# Patient Record
Sex: Female | Born: 1960 | Hispanic: Yes
Health system: Southern US, Community
[De-identification: ages and names within clinical notes are randomized; demographics above are authoritative.]

## PROBLEM LIST (undated history)

## (undated) DIAGNOSIS — E7800 Pure hypercholesterolemia, unspecified: Secondary | ICD-10-CM

## (undated) DIAGNOSIS — I10 Essential (primary) hypertension: Secondary | ICD-10-CM

---

## 2000-05-05 HISTORY — PX: BREAST BIOPSY: SHX20

## 2010-03-20 ENCOUNTER — Other Ambulatory Visit: Admission: RE | Admit: 2010-03-20 | Discharge: 2010-03-20 | Payer: Self-pay | Admitting: Family Medicine

## 2010-07-19 ENCOUNTER — Emergency Department (HOSPITAL_COMMUNITY)
Admission: EM | Admit: 2010-07-19 | Discharge: 2010-07-19 | Disposition: A | Discharge status: Home / Self Care | Payer: 59 | Attending: Emergency Medicine | Admitting: Emergency Medicine

## 2010-07-19 ENCOUNTER — Emergency Department (HOSPITAL_COMMUNITY): Payer: 59

## 2010-07-19 DIAGNOSIS — Z79899 Other long term (current) drug therapy: Secondary | ICD-10-CM | POA: Insufficient documentation

## 2010-07-19 DIAGNOSIS — R51 Headache: Secondary | ICD-10-CM | POA: Insufficient documentation

## 2010-07-19 DIAGNOSIS — R110 Nausea: Secondary | ICD-10-CM | POA: Insufficient documentation

## 2010-07-19 DIAGNOSIS — I10 Essential (primary) hypertension: Secondary | ICD-10-CM | POA: Insufficient documentation

## 2010-07-19 LAB — POCT I-STAT, CHEM 8
Calcium, Ion: 1.1 mmol/L — ABNORMAL LOW (ref 1.12–1.32)
HCT: 41 % (ref 36.0–46.0)
Hemoglobin: 13.9 g/dL (ref 12.0–15.0)
TCO2: 26 mmol/L (ref 0–100)

## 2010-07-19 LAB — CBC
MCH: 23.8 pg — ABNORMAL LOW (ref 26.0–34.0)
Platelets: 286 10*3/uL (ref 150–400)
RBC: 5.01 MIL/uL (ref 3.87–5.11)
RDW: 20.1 % — ABNORMAL HIGH (ref 11.5–15.5)
WBC: 4.8 10*3/uL (ref 4.0–10.5)

## 2010-07-19 LAB — DIFFERENTIAL
Basophils Relative: 2 % — ABNORMAL HIGH (ref 0–1)
Eosinophils Absolute: 0.2 10*3/uL (ref 0.0–0.7)
Neutrophils Relative %: 54 % (ref 43–77)

## 2011-08-08 ENCOUNTER — Other Ambulatory Visit (HOSPITAL_COMMUNITY)
Admission: RE | Admit: 2011-08-08 | Discharge: 2011-08-08 | Disposition: A | Discharge status: Home / Self Care | Payer: 59 | Source: Ambulatory Visit | Attending: Family Medicine | Admitting: Family Medicine

## 2011-08-08 DIAGNOSIS — Z124 Encounter for screening for malignant neoplasm of cervix: Secondary | ICD-10-CM | POA: Insufficient documentation

## 2011-09-10 ENCOUNTER — Other Ambulatory Visit: Payer: Self-pay | Admitting: Family Medicine

## 2011-09-10 DIAGNOSIS — Z1231 Encounter for screening mammogram for malignant neoplasm of breast: Secondary | ICD-10-CM

## 2011-10-07 ENCOUNTER — Ambulatory Visit: Payer: 59

## 2012-10-09 IMAGING — CT CT HEAD W/O CM
1 of 2 series · 13 of 30 positions shown, 17 images · non-contrast
Comparison: None.

CLINICAL DATA: Frontal headache

CT HEAD WITHOUT CONTRAST
TECHNIQUE: Contiguous axial images were obtained from the base of
the skull through the vertex without contrast.

[Series 2: brain · axial · 0.47mm/px · z∈[+133,+256]mm · 13 of 28 slices shown, 17 images]
[im 2/28  brain]
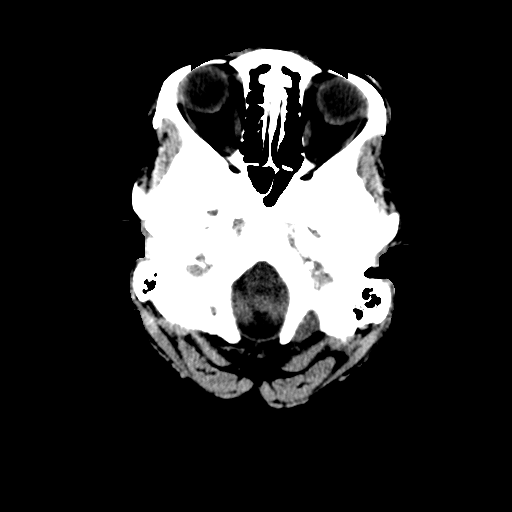
[im 2/28  bone]
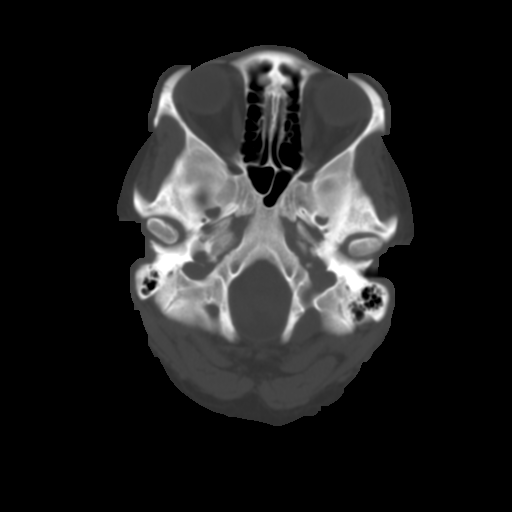
[im 4/28  brain]
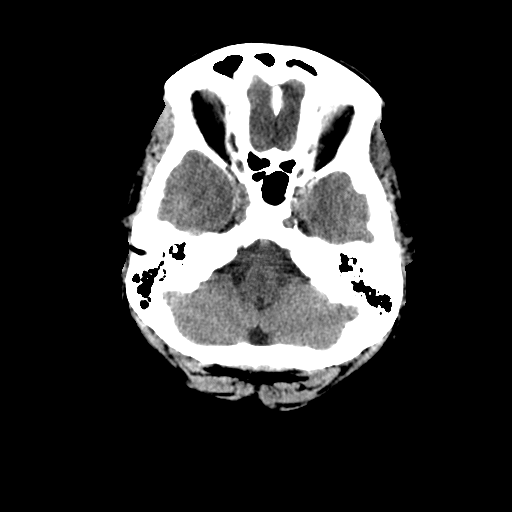
[im 6/28  brain]
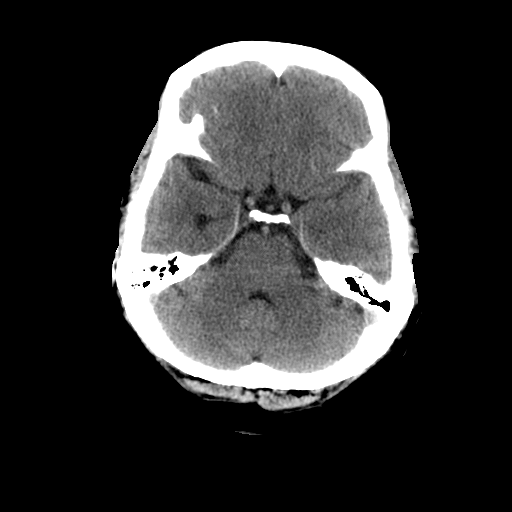
[im 8/28  brain]
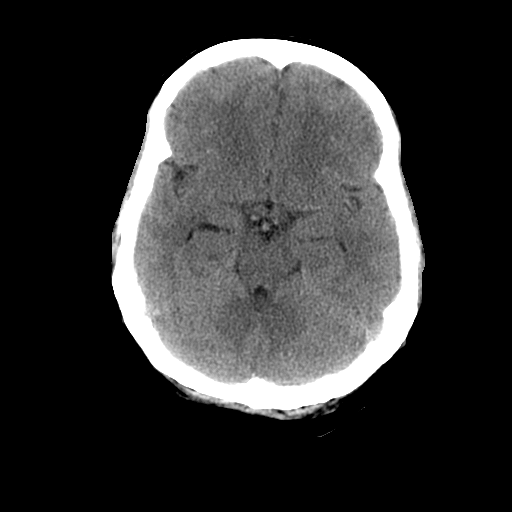
[im 10/28  brain]
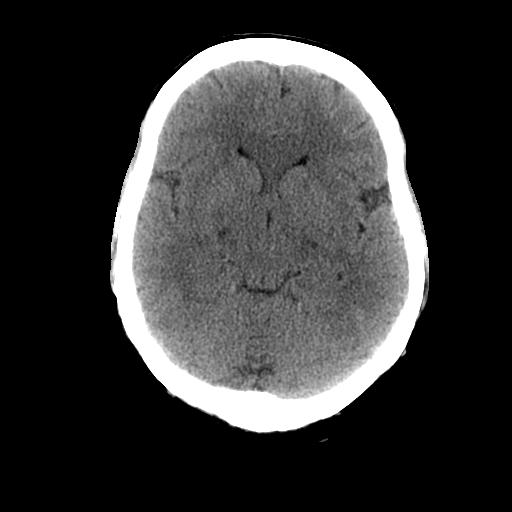
[im 10/28  bone]
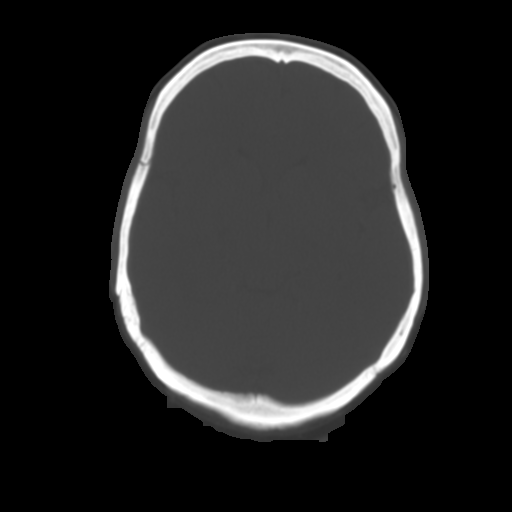
[im 12/28  brain]
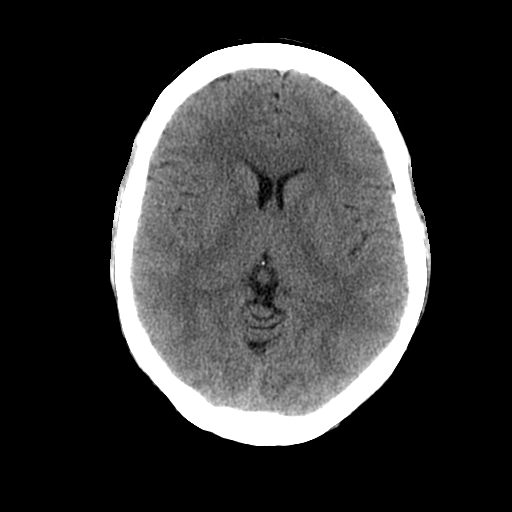
[im 14/28  brain]
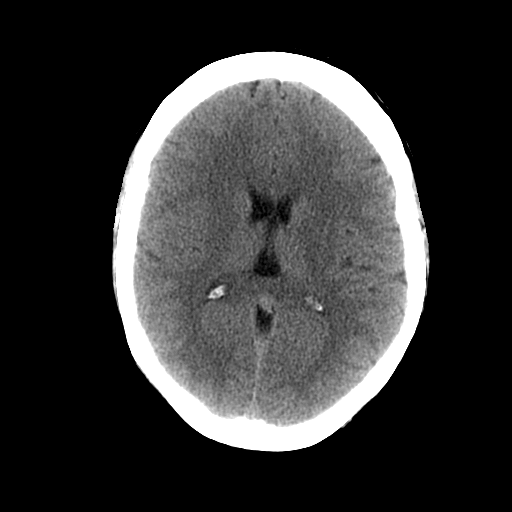
[im 16/28  brain]
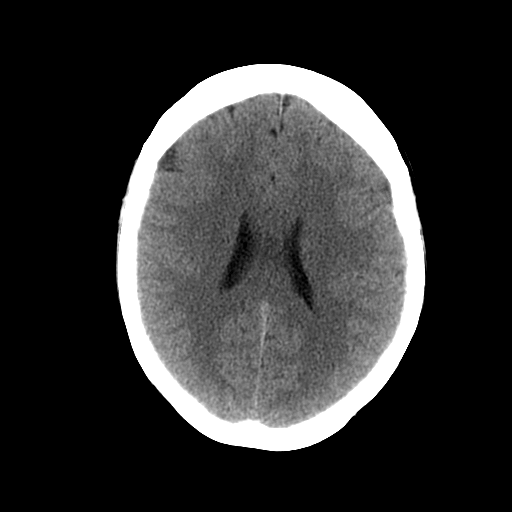
[im 18/28  brain]
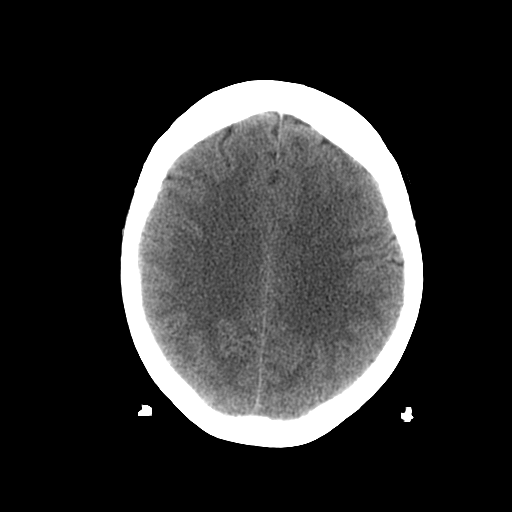
[im 18/28  bone]
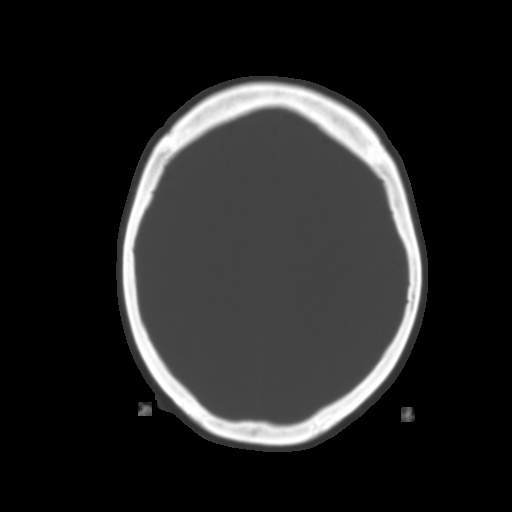
[im 20/28  brain]
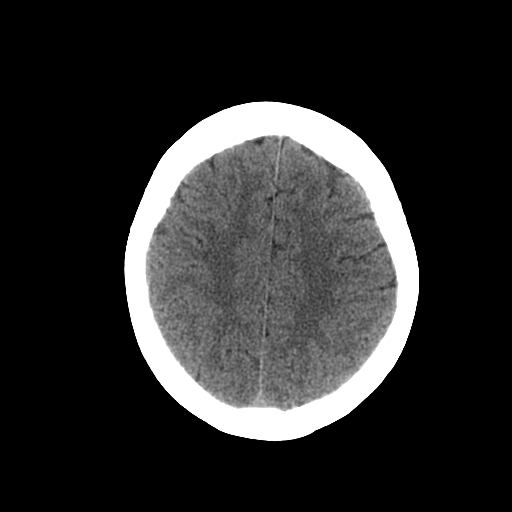
[im 22/28  brain]
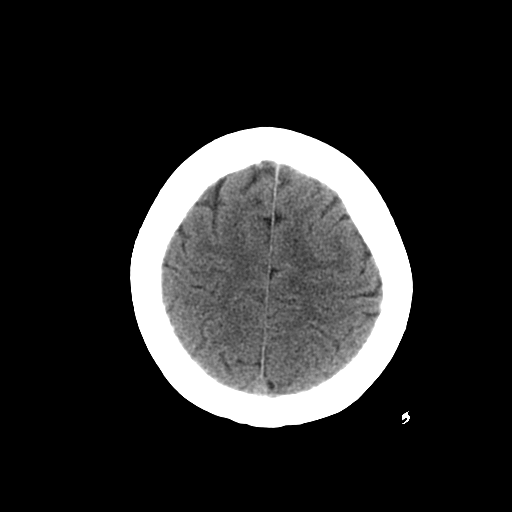
[im 24/28  brain]
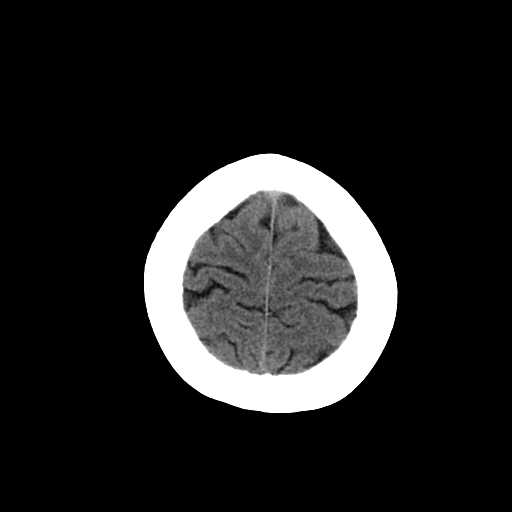
[im 26/28  brain]
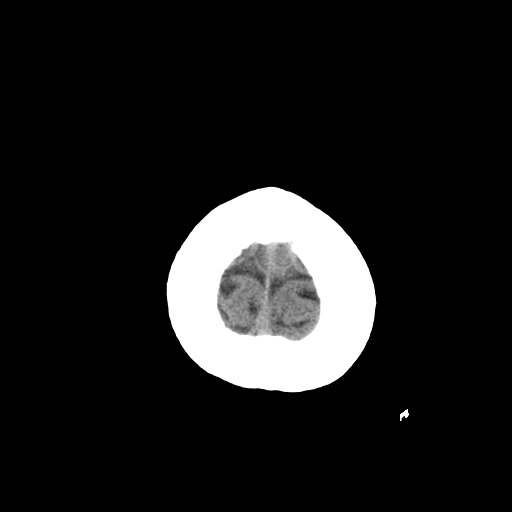
[im 26/28  bone]
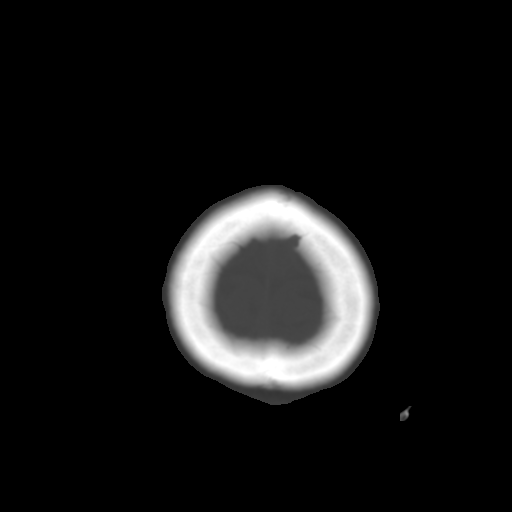

[13 of 30 positions shown; findings below may reference images not displayed]

FINDINGS: No skull fracture is noted.  Paranasal sinuses and
mastoid air cells are unremarkable.

No intracranial hemorrhage, mass effect or midline shift.

No acute infarction.  No mass lesion is noted on this unenhanced
scan.

The gray and white matter differentiation is preserved.

No intra or extra-axial fluid collection.
IMPRESSION: No acute intracranial abnormality.

## 2014-03-06 ENCOUNTER — Other Ambulatory Visit (HOSPITAL_COMMUNITY): Payer: Self-pay | Admitting: Family Medicine

## 2014-03-06 DIAGNOSIS — Z1231 Encounter for screening mammogram for malignant neoplasm of breast: Secondary | ICD-10-CM

## 2014-03-08 ENCOUNTER — Other Ambulatory Visit: Payer: Self-pay

## 2014-03-08 DIAGNOSIS — Z1231 Encounter for screening mammogram for malignant neoplasm of breast: Secondary | ICD-10-CM

## 2014-03-28 ENCOUNTER — Ambulatory Visit (HOSPITAL_COMMUNITY): Payer: 59

## 2014-04-05 ENCOUNTER — Ambulatory Visit
Admission: RE | Admit: 2014-04-05 | Discharge: 2014-04-05 | Disposition: A | Discharge status: Home / Self Care | Payer: 59 | Source: Ambulatory Visit

## 2014-04-05 DIAGNOSIS — Z1231 Encounter for screening mammogram for malignant neoplasm of breast: Secondary | ICD-10-CM

## 2014-07-11 ENCOUNTER — Other Ambulatory Visit (HOSPITAL_COMMUNITY)
Admission: RE | Admit: 2014-07-11 | Discharge: 2014-07-11 | Disposition: A | Discharge status: Home / Self Care | Payer: 59 | Source: Ambulatory Visit | Attending: Family Medicine | Admitting: Family Medicine

## 2014-07-11 ENCOUNTER — Other Ambulatory Visit: Payer: Self-pay | Admitting: Family Medicine

## 2014-07-11 DIAGNOSIS — Z124 Encounter for screening for malignant neoplasm of cervix: Secondary | ICD-10-CM | POA: Diagnosis present

## 2014-07-12 LAB — CYTOLOGY - PAP

## 2016-05-13 DIAGNOSIS — E785 Hyperlipidemia, unspecified: Secondary | ICD-10-CM | POA: Diagnosis not present

## 2016-05-13 DIAGNOSIS — E559 Vitamin D deficiency, unspecified: Secondary | ICD-10-CM | POA: Diagnosis not present

## 2016-07-31 ENCOUNTER — Other Ambulatory Visit (HOSPITAL_COMMUNITY)
Admission: RE | Admit: 2016-07-31 | Discharge: 2016-07-31 | Disposition: A | Discharge status: Home / Self Care | Payer: 59 | Source: Ambulatory Visit | Attending: Family Medicine | Admitting: Family Medicine

## 2016-07-31 ENCOUNTER — Other Ambulatory Visit: Payer: Self-pay | Admitting: Family Medicine

## 2016-07-31 DIAGNOSIS — Z79899 Other long term (current) drug therapy: Secondary | ICD-10-CM | POA: Diagnosis not present

## 2016-07-31 DIAGNOSIS — Z01411 Encounter for gynecological examination (general) (routine) with abnormal findings: Secondary | ICD-10-CM | POA: Diagnosis not present

## 2016-07-31 DIAGNOSIS — Z0000 Encounter for general adult medical examination without abnormal findings: Secondary | ICD-10-CM | POA: Diagnosis not present

## 2016-07-31 DIAGNOSIS — E559 Vitamin D deficiency, unspecified: Secondary | ICD-10-CM | POA: Diagnosis not present

## 2016-07-31 DIAGNOSIS — E785 Hyperlipidemia, unspecified: Secondary | ICD-10-CM | POA: Diagnosis not present

## 2016-07-31 DIAGNOSIS — Z1151 Encounter for screening for human papillomavirus (HPV): Secondary | ICD-10-CM | POA: Diagnosis not present

## 2016-08-05 LAB — CYTOLOGY - PAP
Diagnosis: NEGATIVE
HPV (WINDOPATH): NOT DETECTED

## 2016-08-13 ENCOUNTER — Other Ambulatory Visit: Payer: Self-pay | Admitting: Plastic Surgery

## 2016-08-13 DIAGNOSIS — N62 Hypertrophy of breast: Secondary | ICD-10-CM | POA: Diagnosis not present

## 2016-08-13 DIAGNOSIS — M542 Cervicalgia: Secondary | ICD-10-CM | POA: Diagnosis not present

## 2016-08-13 DIAGNOSIS — E66812 Obesity, class 2: Secondary | ICD-10-CM | POA: Insufficient documentation

## 2016-08-13 DIAGNOSIS — Z1231 Encounter for screening mammogram for malignant neoplasm of breast: Secondary | ICD-10-CM

## 2016-08-28 DIAGNOSIS — Z1211 Encounter for screening for malignant neoplasm of colon: Secondary | ICD-10-CM | POA: Diagnosis not present

## 2016-09-02 ENCOUNTER — Ambulatory Visit
Admission: RE | Admit: 2016-09-02 | Discharge: 2016-09-02 | Disposition: A | Discharge status: Home / Self Care | Payer: 59 | Source: Ambulatory Visit | Attending: Plastic Surgery | Admitting: Plastic Surgery

## 2016-09-02 DIAGNOSIS — Z1231 Encounter for screening mammogram for malignant neoplasm of breast: Secondary | ICD-10-CM | POA: Diagnosis not present

## 2016-11-25 DIAGNOSIS — M542 Cervicalgia: Secondary | ICD-10-CM | POA: Diagnosis not present

## 2016-11-25 DIAGNOSIS — M47816 Spondylosis without myelopathy or radiculopathy, lumbar region: Secondary | ICD-10-CM | POA: Diagnosis not present

## 2016-12-02 DIAGNOSIS — M47816 Spondylosis without myelopathy or radiculopathy, lumbar region: Secondary | ICD-10-CM | POA: Diagnosis not present

## 2016-12-08 DIAGNOSIS — M545 Low back pain: Secondary | ICD-10-CM | POA: Diagnosis not present

## 2017-01-24 DIAGNOSIS — J0100 Acute maxillary sinusitis, unspecified: Secondary | ICD-10-CM | POA: Diagnosis not present

## 2017-01-24 DIAGNOSIS — K047 Periapical abscess without sinus: Secondary | ICD-10-CM | POA: Diagnosis not present

## 2017-02-26 DIAGNOSIS — I10 Essential (primary) hypertension: Secondary | ICD-10-CM | POA: Diagnosis not present

## 2017-02-26 DIAGNOSIS — R300 Dysuria: Secondary | ICD-10-CM | POA: Diagnosis not present

## 2017-08-17 DIAGNOSIS — Z79899 Other long term (current) drug therapy: Secondary | ICD-10-CM | POA: Diagnosis not present

## 2017-08-17 DIAGNOSIS — E785 Hyperlipidemia, unspecified: Secondary | ICD-10-CM | POA: Diagnosis not present

## 2017-08-17 DIAGNOSIS — R7301 Impaired fasting glucose: Secondary | ICD-10-CM | POA: Diagnosis not present

## 2017-08-17 DIAGNOSIS — Z0000 Encounter for general adult medical examination without abnormal findings: Secondary | ICD-10-CM | POA: Diagnosis not present

## 2017-08-17 DIAGNOSIS — D509 Iron deficiency anemia, unspecified: Secondary | ICD-10-CM | POA: Diagnosis not present

## 2017-08-28 DIAGNOSIS — M25561 Pain in right knee: Secondary | ICD-10-CM | POA: Diagnosis not present

## 2017-08-28 DIAGNOSIS — M1711 Unilateral primary osteoarthritis, right knee: Secondary | ICD-10-CM | POA: Diagnosis not present

## 2017-08-28 DIAGNOSIS — G8929 Other chronic pain: Secondary | ICD-10-CM | POA: Diagnosis not present

## 2017-08-31 DIAGNOSIS — R7303 Prediabetes: Secondary | ICD-10-CM | POA: Diagnosis not present

## 2017-09-08 DIAGNOSIS — M6281 Muscle weakness (generalized): Secondary | ICD-10-CM | POA: Diagnosis not present

## 2017-09-08 DIAGNOSIS — M25561 Pain in right knee: Secondary | ICD-10-CM | POA: Diagnosis not present

## 2017-09-08 DIAGNOSIS — R262 Difficulty in walking, not elsewhere classified: Secondary | ICD-10-CM | POA: Diagnosis not present

## 2017-09-10 DIAGNOSIS — M25561 Pain in right knee: Secondary | ICD-10-CM | POA: Diagnosis not present

## 2017-09-10 DIAGNOSIS — M6281 Muscle weakness (generalized): Secondary | ICD-10-CM | POA: Diagnosis not present

## 2017-09-10 DIAGNOSIS — R262 Difficulty in walking, not elsewhere classified: Secondary | ICD-10-CM | POA: Diagnosis not present

## 2017-09-15 DIAGNOSIS — M6281 Muscle weakness (generalized): Secondary | ICD-10-CM | POA: Diagnosis not present

## 2017-09-15 DIAGNOSIS — M25561 Pain in right knee: Secondary | ICD-10-CM | POA: Diagnosis not present

## 2017-09-15 DIAGNOSIS — R262 Difficulty in walking, not elsewhere classified: Secondary | ICD-10-CM | POA: Diagnosis not present

## 2017-11-28 DIAGNOSIS — J069 Acute upper respiratory infection, unspecified: Secondary | ICD-10-CM | POA: Diagnosis not present

## 2017-11-28 DIAGNOSIS — H66002 Acute suppurative otitis media without spontaneous rupture of ear drum, left ear: Secondary | ICD-10-CM | POA: Diagnosis not present

## 2018-04-22 DIAGNOSIS — R05 Cough: Secondary | ICD-10-CM | POA: Diagnosis not present

## 2018-05-17 DIAGNOSIS — D259 Leiomyoma of uterus, unspecified: Secondary | ICD-10-CM | POA: Diagnosis not present

## 2018-05-17 DIAGNOSIS — R399 Unspecified symptoms and signs involving the genitourinary system: Secondary | ICD-10-CM | POA: Diagnosis not present

## 2018-06-07 DIAGNOSIS — R300 Dysuria: Secondary | ICD-10-CM | POA: Diagnosis not present

## 2018-06-07 DIAGNOSIS — D259 Leiomyoma of uterus, unspecified: Secondary | ICD-10-CM | POA: Diagnosis not present

## 2018-06-09 DIAGNOSIS — D259 Leiomyoma of uterus, unspecified: Secondary | ICD-10-CM | POA: Diagnosis not present

## 2019-08-18 ENCOUNTER — Ambulatory Visit: Payer: Self-pay | Attending: Internal Medicine

## 2019-08-18 DIAGNOSIS — Z23 Encounter for immunization: Secondary | ICD-10-CM

## 2019-08-18 NOTE — Progress Notes (Signed)
   Covid-19 Vaccination Clinic  Name:  Alyssa Howe    MRN: [id number] DOB: Dec 23, 1960  08/18/2019  Alyssa Howe was observed post Covid-19 immunization for 15 minutes without incident. She was provided with Vaccine Information Sheet and instruction to access the V-Safe system.   Alyssa Howe was instructed to call 911 with any severe reactions post vaccine: Marland Kitchen Difficulty breathing  . Swelling of face and throat  . A fast heartbeat  . A bad rash all over body  . Dizziness and weakness   Immunizations Administered    Name Date Dose VIS Date Route   Pfizer COVID-19 Vaccine 08/18/2019 11:24 AM 0.3 mL 04/15/2019 Intramuscular   Manufacturer: [place]   Lot: [id number]   [place]: [id number]

## 2019-09-12 ENCOUNTER — Ambulatory Visit: Payer: Self-pay | Attending: Internal Medicine

## 2019-09-12 DIAGNOSIS — Z23 Encounter for immunization: Secondary | ICD-10-CM

## 2019-09-12 NOTE — Progress Notes (Signed)
   Covid-19 Vaccination Clinic  Name:  Alyssa Howe    MRN: [id number] DOB: 11-Apr-1961  09/12/2019  Alyssa Howe was observed post Covid-19 immunization for 15 minutes without incident. She was provided with Vaccine Information Sheet and instruction to access the V-Safe system.   Alyssa Howe was instructed to call 911 with any severe reactions post vaccine: Marland Kitchen Difficulty breathing  . Swelling of face and throat  . A fast heartbeat  . A bad rash all over body  . Dizziness and weakness   Immunizations Administered    Name Date Dose VIS Date Route   Pfizer COVID-19 Vaccine 09/12/2019 11:09 AM 0.3 mL 06/29/2018 Intramuscular   Manufacturer: [place]   Lot: [id number]   [place]: [id number]

## 2019-11-22 ENCOUNTER — Other Ambulatory Visit (HOSPITAL_COMMUNITY)
Admission: RE | Admit: 2019-11-22 | Discharge: 2019-11-22 | Disposition: A | Discharge status: Home / Self Care | Payer: Managed Care, Other (non HMO) | Source: Ambulatory Visit | Attending: Family Medicine | Admitting: Family Medicine

## 2019-11-22 ENCOUNTER — Other Ambulatory Visit: Payer: Self-pay | Admitting: Family Medicine

## 2019-11-22 DIAGNOSIS — Z01411 Encounter for gynecological examination (general) (routine) with abnormal findings: Secondary | ICD-10-CM | POA: Diagnosis present

## 2019-11-23 LAB — CYTOLOGY - PAP: Diagnosis: NEGATIVE

## 2020-03-07 ENCOUNTER — Ambulatory Visit (INDEPENDENT_AMBULATORY_CARE_PROVIDER_SITE_OTHER): Payer: Managed Care, Other (non HMO)

## 2020-03-07 ENCOUNTER — Other Ambulatory Visit: Payer: Self-pay

## 2020-03-07 ENCOUNTER — Ambulatory Visit: Payer: Managed Care, Other (non HMO) | Admitting: Podiatry

## 2020-03-07 DIAGNOSIS — M722 Plantar fascial fibromatosis: Secondary | ICD-10-CM

## 2020-03-07 MED ORDER — MELOXICAM 15 MG PO TABS: 15.0000 mg | ORAL_TABLET | Freq: Every day | ORAL | 1 refills | Status: DC

## 2020-03-07 NOTE — Progress Notes (Signed)
   Subjective: 59 y.o. female presenting today for evaluation of bilateral heel pain right greater than the left.  Pain is been present for several months now.  She states that she walks approximately 8 miles per day with her dog.  She is very active however she has had consistent pain.  She went and got a new pair shoes which do help somewhat with the pain.   No past medical history on file.   Objective: Physical Exam General: The patient is alert and oriented x3 in no acute distress.  Dermatology: Skin is warm, dry and supple bilateral lower extremities. Negative for open lesions or macerations bilateral.   Vascular: Dorsalis Pedis and Posterior Tibial pulses palpable bilateral.  Capillary fill time is immediate to all digits.  Neurological: Epicritic and protective threshold intact bilateral.   Musculoskeletal: Tenderness to palpation to the plantar aspect of the bilateral heels along the plantar fascia. All other joints range of motion within normal limits bilateral. Strength 5/5 in all groups bilateral.   Radiographic exam: Normal osseous mineralization. Joint spaces preserved. No fracture/dislocation/boney destruction. No other soft tissue abnormalities or radiopaque foreign bodies.   Assessment: 1. plantar fasciitis bilateral feet  Plan of Care:  1. Patient evaluated. Xrays reviewed.   2. Injection of 0.5cc Celestone soluspan injected into the right heel 3.  Continue biking and walking as tolerated. 4.  Rx for Meloxicam ordered for patient. 5. Plantar fascial band(s) dispensed for bilateral plantar fasciitis. 6. Instructed patient regarding therapies and modalities at home to alleviate symptoms.  7. Return to clinic in 4 weeks.    *Originally from [place].  Walks her pitbull dog 8 miles per day  Edrick Kins, DPM [facility]  Dr. Edrick Kins, DPM    [address], [place] [address]                Office  [phone]  Fax [phone]

## 2021-03-06 ENCOUNTER — Other Ambulatory Visit (HOSPITAL_BASED_OUTPATIENT_CLINIC_OR_DEPARTMENT_OTHER): Payer: Self-pay | Admitting: Family Medicine

## 2021-03-06 DIAGNOSIS — E785 Hyperlipidemia, unspecified: Secondary | ICD-10-CM

## 2021-03-06 DIAGNOSIS — R7303 Prediabetes: Secondary | ICD-10-CM

## 2021-03-06 DIAGNOSIS — I10 Essential (primary) hypertension: Secondary | ICD-10-CM

## 2021-04-24 ENCOUNTER — Other Ambulatory Visit: Payer: Self-pay | Admitting: Family Medicine

## 2021-04-24 ENCOUNTER — Ambulatory Visit
Admission: RE | Admit: 2021-04-24 | Discharge: 2021-04-24 | Disposition: A | Discharge status: Home / Self Care | Payer: Managed Care, Other (non HMO) | Source: Ambulatory Visit | Attending: Family Medicine | Admitting: Family Medicine

## 2021-04-24 ENCOUNTER — Other Ambulatory Visit: Payer: Self-pay

## 2021-04-24 DIAGNOSIS — R109 Unspecified abdominal pain: Secondary | ICD-10-CM

## 2021-04-24 MED ORDER — IOPAMIDOL (ISOVUE-300) INJECTION 61%
100.0000 mL | Freq: Once | INTRAVENOUS | Status: AC | PRN
  Administered 2021-04-24: 11:00:00 100 mL via INTRAVENOUS

## 2021-05-28 ENCOUNTER — Other Ambulatory Visit: Payer: Self-pay

## 2021-05-28 ENCOUNTER — Emergency Department (HOSPITAL_BASED_OUTPATIENT_CLINIC_OR_DEPARTMENT_OTHER): Payer: Managed Care, Other (non HMO)

## 2021-05-28 ENCOUNTER — Encounter (HOSPITAL_BASED_OUTPATIENT_CLINIC_OR_DEPARTMENT_OTHER): Payer: Self-pay

## 2021-05-28 ENCOUNTER — Emergency Department (HOSPITAL_BASED_OUTPATIENT_CLINIC_OR_DEPARTMENT_OTHER)
Admission: EM | Admit: 2021-05-28 | Discharge: 2021-05-28 | Disposition: A | Discharge status: Home / Self Care | Payer: Managed Care, Other (non HMO) | Attending: Emergency Medicine | Admitting: Emergency Medicine

## 2021-05-28 DIAGNOSIS — R1032 Left lower quadrant pain: Secondary | ICD-10-CM | POA: Diagnosis present

## 2021-05-28 DIAGNOSIS — I10 Essential (primary) hypertension: Secondary | ICD-10-CM | POA: Diagnosis not present

## 2021-05-28 DIAGNOSIS — Z79899 Other long term (current) drug therapy: Secondary | ICD-10-CM | POA: Diagnosis not present

## 2021-05-28 DIAGNOSIS — D259 Leiomyoma of uterus, unspecified: Secondary | ICD-10-CM

## 2021-05-28 HISTORY — DX: Pure hypercholesterolemia, unspecified: E78.00

## 2021-05-28 HISTORY — DX: Essential (primary) hypertension: I10

## 2021-05-28 LAB — COMPREHENSIVE METABOLIC PANEL
ALT: 19 U/L (ref 0–44)
AST: 18 U/L (ref 15–41)
Albumin: 4.7 g/dL (ref 3.5–5.0)
Alkaline Phosphatase: 46 U/L (ref 38–126)
Anion gap: 13 (ref 5–15)
BUN: 17 mg/dL (ref 8–23)
CO2: 25 mmol/L (ref 22–32)
Calcium: 9.6 mg/dL (ref 8.9–10.3)
Chloride: 100 mmol/L (ref 98–111)
Creatinine, Ser: 0.71 mg/dL (ref 0.44–1.00)
GFR, Estimated: >60 mL/min (ref >60)
Glucose, Bld: 122 mg/dL — ABNORMAL HIGH (ref 70–99)
Potassium: 3.6 mmol/L (ref 3.5–5.1)
Sodium: 138 mmol/L (ref 135–145)
Total Bilirubin: 0.6 mg/dL (ref 0.3–1.2)
Total Protein: 7.8 g/dL (ref 6.5–8.1)

## 2021-05-28 LAB — URINALYSIS, ROUTINE W REFLEX MICROSCOPIC
Bilirubin Urine: NEGATIVE
Glucose, UA: NEGATIVE mg/dL
Hgb urine dipstick: NEGATIVE
Ketones, ur: NEGATIVE mg/dL
Leukocytes,Ua: NEGATIVE
Nitrite: NEGATIVE
Protein, ur: NEGATIVE mg/dL
Specific Gravity, Urine: 1.009 (ref 1.005–1.030)
pH: 6 (ref 5.0–8.0)

## 2021-05-28 LAB — LIPASE, BLOOD: Lipase: 31 U/L (ref 11–51)

## 2021-05-28 LAB — CBC
HCT: 47 % — ABNORMAL HIGH (ref 36.0–46.0)
Hemoglobin: 15.8 g/dL — ABNORMAL HIGH (ref 12.0–15.0)
MCH: 30.2 pg (ref 26.0–34.0)
MCHC: 33.6 g/dL (ref 30.0–36.0)
MCV: 89.7 fL (ref 80.0–100.0)
Platelets: 281 10*3/uL (ref 150–400)
RBC: 5.24 MIL/uL — ABNORMAL HIGH (ref 3.87–5.11)
RDW: 13.7 % (ref 11.5–15.5)
WBC: 7.3 10*3/uL (ref 4.0–10.5)
nRBC: 0 % (ref 0.0–0.2)

## 2021-05-28 MED ORDER — KETOROLAC TROMETHAMINE 30 MG/ML IJ SOLN
30.0000 mg | Freq: Once | INTRAMUSCULAR | Status: AC
  Administered 2021-05-28: 16:00:00 30 mg via INTRAVENOUS
  Filled 2021-05-28: qty 1

## 2021-05-28 MED ORDER — IBUPROFEN 600 MG PO TABS: 600.0000 mg | ORAL_TABLET | Freq: Three times a day (TID) | ORAL | 0 refills | Status: AC | PRN

## 2021-05-28 MED ORDER — IOHEXOL 300 MG/ML  SOLN
85.0000 mL | Freq: Once | INTRAMUSCULAR | Status: AC | PRN
  Administered 2021-05-28: 16:00:00 85 mL via INTRAVENOUS

## 2021-05-28 NOTE — ED Provider Notes (Signed)
Patient presenting with abdominal pain, radiculopathy into leg when standing.  +Urinary frequency, UA without sign of infection.  Hgb and WBC unremarkable.  CMP unremarkable.  Pending CT abdomen pelvis for nonspecific abdominal pain.  CT scan shows no acute findings other than a large uterine fibroid, which may be contributing to her pain.  We will give her some IV Toradol due to her pain here and showed significant improvement of her symptoms.  She does have an OB/GYN appointment in March.  She is not taking NSAIDs at home.  We can start her on a course of this in addition to the Tylenol that she is taking.  Answered all of her questions and her husband at bedside.  Okay for discharge   Wyvonnia Dusky, MD 05/28/21 [phone]

## 2021-05-28 NOTE — ED Notes (Signed)
Unable to get vitals at this pt states she is in pain.

## 2021-05-28 NOTE — ED Triage Notes (Signed)
Pt reports waking this am with lower abd pain radiating to her back and down into her legs. Pt reports having back pain Sunday. Pt had an abd CT scan d/t pain to RLQ area in December. Pt also reports feeling "gassy" and nauseous, took Prilosec and gas ex w/no relief. Pt denies abnormal vaginal odor/discharge. Last BM today but states, "I feel like it could be more" pt describes her stool as "thin"

## 2021-05-28 NOTE — ED Notes (Signed)
Pt is standing up beside bed. States that she cannot lay down she is in too much pain. Pt not hooked up to monitor at the moment.

## 2021-05-28 NOTE — ED Provider Notes (Signed)
[facility] EMERGENCY DEPT Provider Note   CSN: [id number] Arrival date & time: 05/28/21  1300     History  Chief Complaint  Patient presents with   Abdominal Pain    Alyssa Howe is a 61 y.o. female.  Patient is a 61 year old female with a history of hypertension high cholesterol status post C-sections but no other abdominal surgeries presenting today with abdominal pain.  Patient reports that she had some mild back pain on Sunday but had been doing well until today when she developed a severe pain in the left side of her back, abdomen that would go into her legs.  It was worse if she was standing and bending over.  Laying down and not moving seems to make the pain a little bit better.  The pain is still present and still severe.  She was also's felt bloated and gassy.  She has tried to have a bowel movement but reports that it has been small and she feels like there is more there.  She has not had any fever, vomiting but has had nausea.  She is having frequent urination but no dysuria.  She knows that she has a uterine fibroid that is large in nature and she is waiting to follow-up with OB/GYN to see if that needs further intervention.  She is concerned that that might be causing some of the problem.  She has not had any vaginal bleeding.  She does report having abdominal pain in December and she was seen in the emergency room for that however she reports this pain is very different than the pain she had back then which resulted in a negative CT.  She did have blood in her stool that she reports through her PCP and she is waiting to be set up with a colonoscopy.  She does not smoke or use alcohol.  She denies history of chronic abdominal pain.  The history is provided by the patient and medical records.  Abdominal Pain     Home Medications Prior to Admission medications   Medication Sig Start Date End Date Taking? Authorizing Provider  acetaminophen (TYLENOL) 500 MG tablet  Take 500 mg by mouth every 6 (six) hours as needed.   Yes [provider]  Ascorbic Acid (VITAMIN C PO) Take by mouth.   Yes [provider]  olmesartan-hydrochlorothiazide (BENICAR HCT) 40-12.5 MG tablet Take 1 tablet by mouth daily. 05/10/21  Yes [provider]  Omeprazole Magnesium (PRILOSEC OTC PO) Take by mouth.   Yes [provider]  pravastatin (PRAVACHOL) 20 MG tablet Take 20 mg by mouth daily. 02/25/21  Yes [provider]  simethicone (GAS-X) 80 MG chewable tablet Chew 80 mg by mouth every 6 (six) hours as needed for flatulence.   Yes [provider]  VITAMIN D, CHOLECALCIFEROL, PO Take by mouth.   Yes [provider]  meloxicam (MOBIC) 15 MG tablet Take 1 tablet (15 mg total) by mouth daily. Patient not taking: Reported on 05/28/2021 03/07/20   Edrick Kins, DPM      Allergies    Patient has no known allergies.    Review of Systems   Review of Systems  Gastrointestinal:  Positive for abdominal pain.   Physical Exam Updated Vital Signs BP (!) 145/86 (BP Location: Right Arm)    Pulse 95    Temp (!) 97.5 F (36.4 C) (Oral)    Resp 16    SpO2 100%  Physical Exam Vitals and nursing note reviewed.  Constitutional:      General: She is not in acute distress.    Appearance: She is well-developed.  HENT:     Head: Normocephalic and atraumatic.  Eyes:     Pupils: Pupils are equal, round, and reactive to light.  Cardiovascular:     Rate and Rhythm: Normal rate and regular rhythm.     Heart sounds: Normal heart sounds. No murmur heard.   No friction rub.  Pulmonary:     Effort: Pulmonary effort is normal.     Breath sounds: Normal breath sounds. No wheezing or rales.  Abdominal:     General: Bowel sounds are normal. There is no distension.     Palpations: Abdomen is soft.     Tenderness: There is abdominal tenderness in the suprapubic area and left lower quadrant. There is no left CVA tenderness, guarding or  rebound.  Musculoskeletal:        General: No tenderness. Normal range of motion.     Right lower leg: No edema.     Left lower leg: No edema.     Comments: No edema  Skin:    General: Skin is warm and dry.     Findings: No rash.  Neurological:     Mental Status: She is alert and oriented to person, place, and time. Mental status is at baseline.     Cranial Nerves: No cranial nerve deficit.  Psychiatric:        Behavior: Behavior normal.    ED Results / Procedures / Treatments   Labs (all labs ordered are listed, but only abnormal results are displayed) Labs Reviewed  COMPREHENSIVE METABOLIC PANEL - Abnormal; Notable for the following components:      Result Value   Glucose, Bld 122 (*)    All other components within normal limits  CBC - Abnormal; Notable for the following components:   RBC 5.24 (*)    Hemoglobin 15.8 (*)    HCT 47.0 (*)    All other components within normal limits  URINALYSIS, ROUTINE W REFLEX MICROSCOPIC - Abnormal; Notable for the following components:   Color, Urine COLORLESS (*)    All other components within normal limits  LIPASE, BLOOD    EKG None  Radiology No results found.  Procedures Procedures    Medications Ordered in ED Medications - No data to display  ED Course/ Medical Decision Making/ A&P                           Medical Decision Making Amount and/or Complexity of Data Reviewed Labs: ordered. Decision-making details documented in ED Course. Radiology: ordered.   61 year old female presenting today with sudden onset of lower abdominal pain.  The pain does radiate into her back and her legs however she has normal pulses in all extremities she is able to move her legs while laying without reproducing the pain.  Negative psoas sign.  Low suspicion for retroperitoneal hemorrhage, ischemic bowel.  Possibility for ovarian torsion versus diverticulitis versus renal stone.  Lower suspicion for UTI as patient's UA here today is within  normal limits.  I independently evaluated and interpreted patient's labs and she has a normal CBC, CMP and UA.  Patient has no for abdominal pain or concern for hepatitis, cholecystitis or pancreatitis.  CT is pending for further information.  Possibility the patient's symptoms could also be musculoskeletal.  No evidence of rash or findings concerning for zoster at this time.  Patient  is not wanting any medications.  Imaging is pending.  External medical records from patient's PCP at [facility] were reviewed.  She has no social barriers to care.  Findings were discussed with the patient and her family member who is in the room.        Final Clinical Impression(s) / ED Diagnoses Final diagnoses:  None    Rx / DC Orders ED Discharge Orders     None         Blanchie Dessert, MD 05/28/21 [phone]

## 2021-09-02 HISTORY — PX: COLONOSCOPY: SHX174

## 2023-07-16 IMAGING — CT CT ABD-PELV W/ CM
2 of 5 series · 15 of 46 positions shown, 17 images · IV contrast (iopamidol)
Comparison: None.

CLINICAL DATA: Abdominal pain and bloating

EXAM:
CT ABDOMEN AND PELVIS WITH CONTRAST
TECHNIQUE: Multidetector CT imaging of the abdomen and pelvis was performed
using the standard protocol following bolus administration of
intravenous contrast.
CONTRAST:  100mL [id number]-388 IOPAMIDOL ([id number]-388) INJECTION 61%

[Series 4: abd pelvis 2.00 br40 s3 cor · coronal · 0.78mm/px · 3 of 146 slices shown]
[im 49/146  soft-tissue]
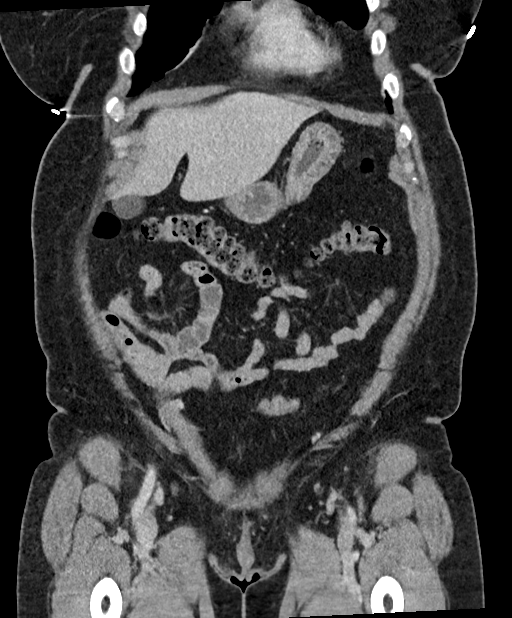
[im 65/146  soft-tissue]
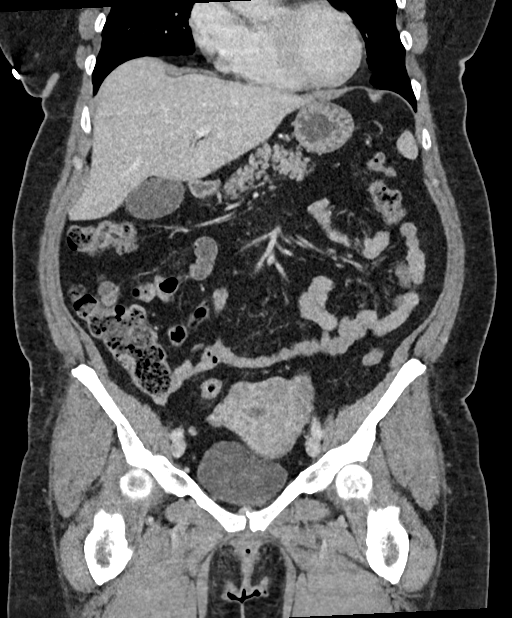
[im 81/146  soft-tissue]
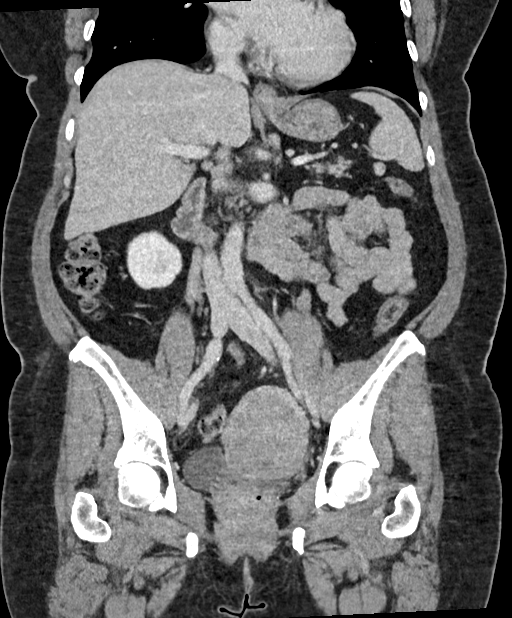

[Series 9: abd pelvis 5.00 br40 s3 axial · axial · 0.73mm/px · z∈[+1107,+1527]mm · 12 of 96 slices shown, 14 images]
[im 6/96  soft-tissue]
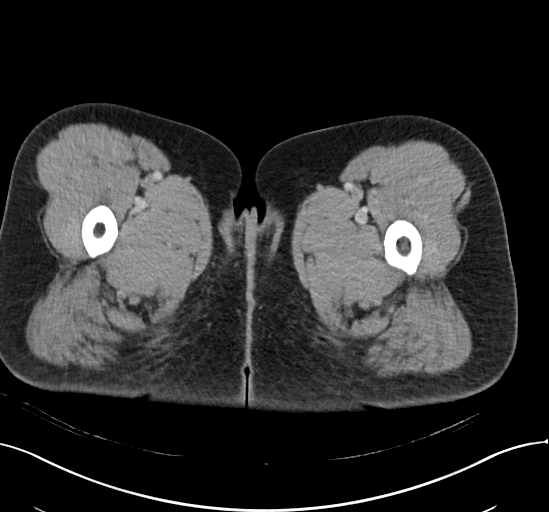
[im 6/96  bone]
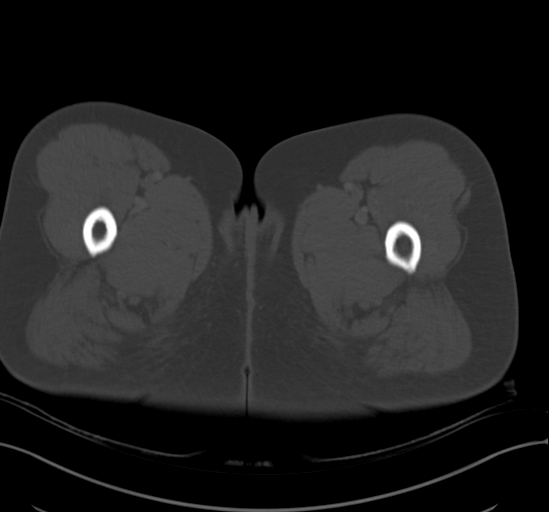
[im 12/96  soft-tissue]
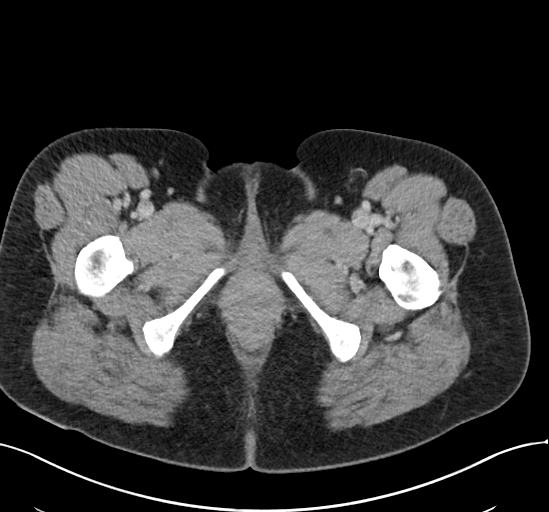
[im 24/96  soft-tissue]
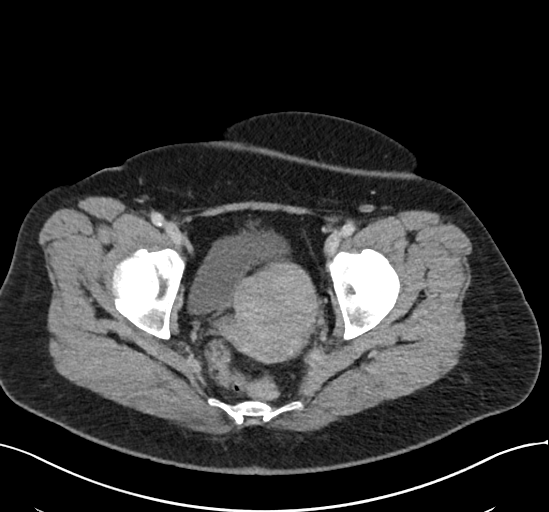
[im 30/96  soft-tissue]
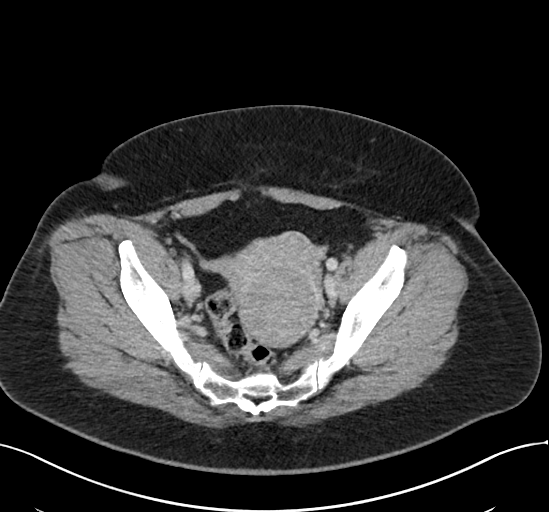
[im 36/96  soft-tissue]
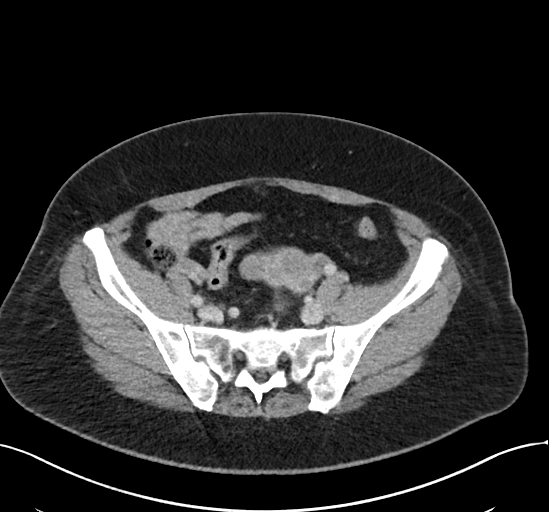
[im 42/96  soft-tissue]
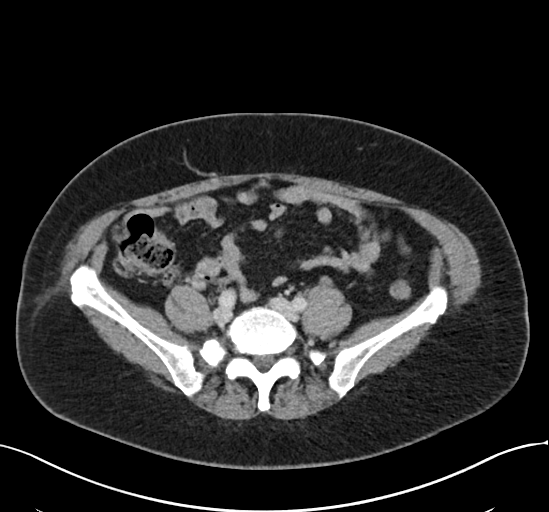
[im 54/96  soft-tissue]
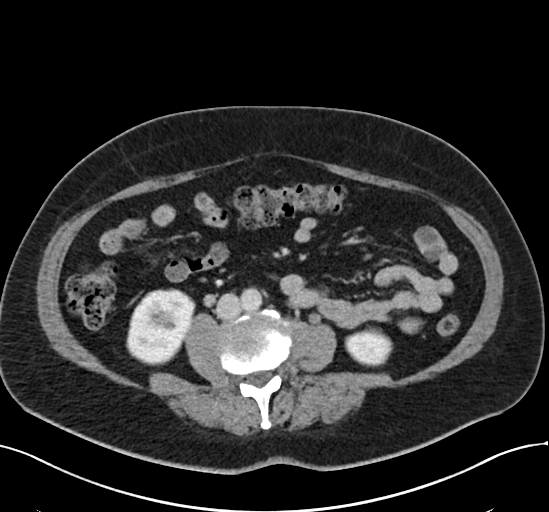
[im 60/96  soft-tissue]
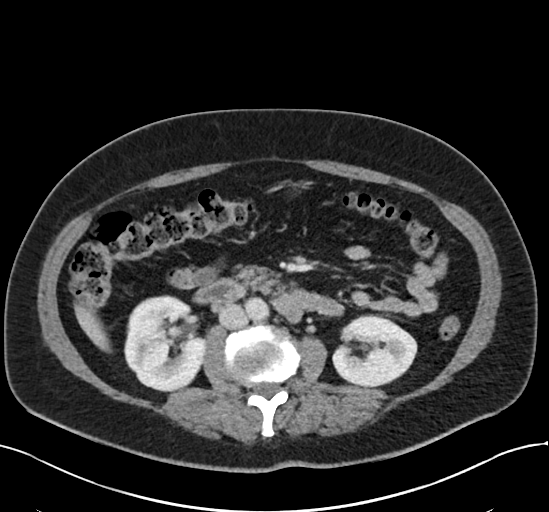
[im 66/96  soft-tissue]
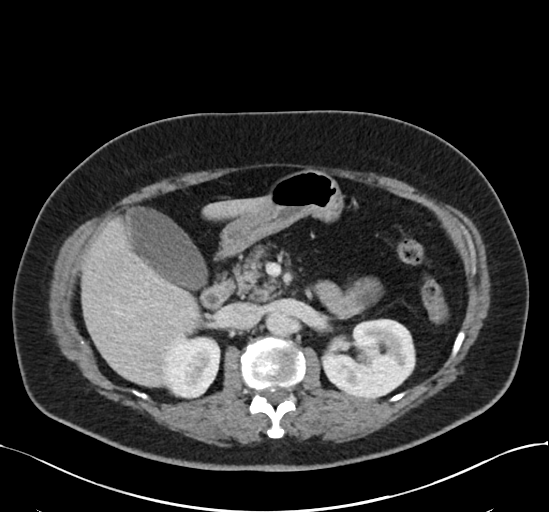
[im 66/96  bone]
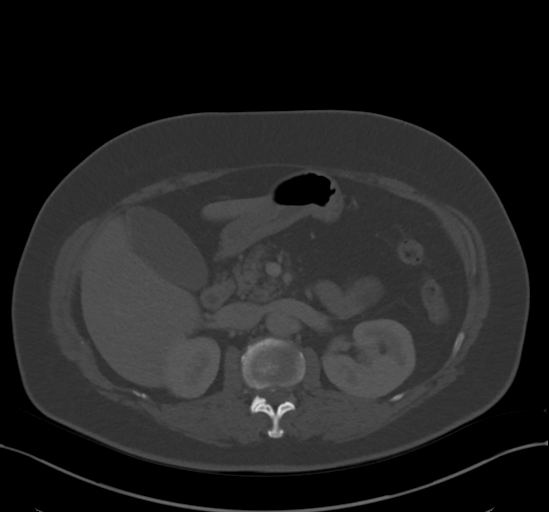
[im 72/96  soft-tissue]
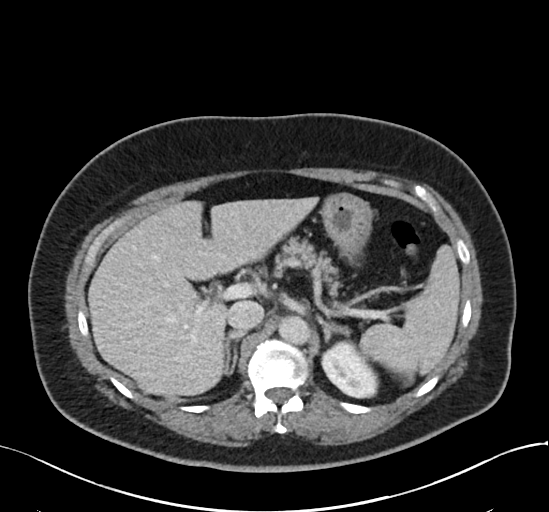
[im 84/96  soft-tissue]
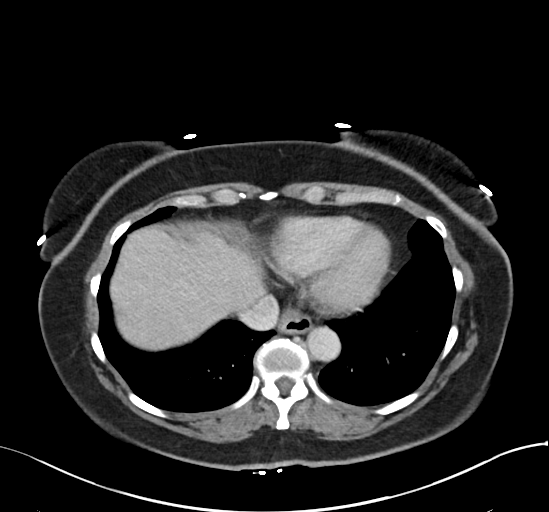
[im 90/96  soft-tissue]
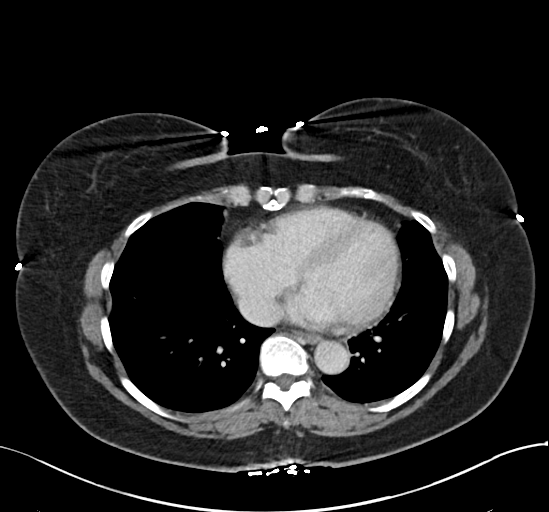

[15 of 46 positions shown; findings below may reference images not displayed]

FINDINGS: Lower chest: No acute abnormality.

Hepatobiliary: Liver is normal in size and contour. 1 cm hypodense
cyst identified in segment 4. Gallbladder is moderately distended
with no wall thickening or pericholecystic fluid visualized. No
biliary ductal dilatation.

Pancreas: Unremarkable. No pancreatic ductal dilatation or
surrounding inflammatory changes.

Spleen: Normal in size without focal abnormality.

Adrenals/Urinary Tract: Adrenal glands are unremarkable. Kidneys are
normal, without renal calculi, focal lesion, or hydronephrosis.
Bladder is unremarkable.

Stomach/Bowel: Small hiatal hernia. No bowel obstruction, free air
or pneumatosis. No bowel wall edema identified. Scattered colonic
diverticula. Appendix is normal.

Vascular/Lymphatic: No significant vascular findings are present. No
enlarged abdominal or pelvic lymph nodes.

Reproductive: Uterus is enlarged and lobulated with likely fibroids,
largest likely fibroid measures 7 x 6.7 x 6.3 cm.

Other: Small umbilical hernia containing fat.  No ascites.

Musculoskeletal: No acute or significant osseous findings.
IMPRESSION: 1. No acute process identified in the abdomen or pelvis.
2. Small hiatal hernia.
3. Enlarged fibroid uterus.

## 2023-08-12 ENCOUNTER — Other Ambulatory Visit: Payer: Self-pay | Admitting: Family Medicine

## 2023-08-12 ENCOUNTER — Other Ambulatory Visit (HOSPITAL_COMMUNITY)
Admission: RE | Admit: 2023-08-12 | Discharge: 2023-08-12 | Disposition: A | Discharge status: Home / Self Care | Source: Ambulatory Visit | Attending: Family Medicine | Admitting: Family Medicine

## 2023-08-12 DIAGNOSIS — Z01411 Encounter for gynecological examination (general) (routine) with abnormal findings: Secondary | ICD-10-CM | POA: Diagnosis present

## 2023-08-14 LAB — MICROALBUMIN / CREATININE URINE RATIO: Microalb Creat Ratio: 167.1

## 2023-08-14 LAB — PROTEIN / CREATININE RATIO, URINE: Creatinine, Urine: 95

## 2023-08-14 LAB — MICROALBUMIN, URINE: Microalb, Ur: 15.81

## 2023-08-14 LAB — BASIC METABOLIC PANEL WITH GFR: Creatinine: 0.7 (ref 0.5–1.1)

## 2023-08-14 LAB — COMPREHENSIVE METABOLIC PANEL WITH GFR: eGFR: 94

## 2023-08-14 LAB — HEMOGLOBIN A1C: Hemoglobin A1C: 6.1

## 2023-08-19 IMAGING — CT CT ABD-PELV W/ CM
2 of 5 series · 17 of 46 positions shown, 19 images · IV contrast (APPLIED)
Comparison: CT 04/24/2021

CLINICAL DATA: Left lower quadrant pain

EXAM:
CT ABDOMEN AND PELVIS WITH CONTRAST
TECHNIQUE: Multidetector CT imaging of the abdomen and pelvis was performed
using the standard protocol following bolus administration of
intravenous contrast.

[Series 2: abd pel w · axial · 0.77mm/px · z∈[-483,-88]mm · 14 of 89 slices shown, 16 images]
[im 5/89  soft-tissue]
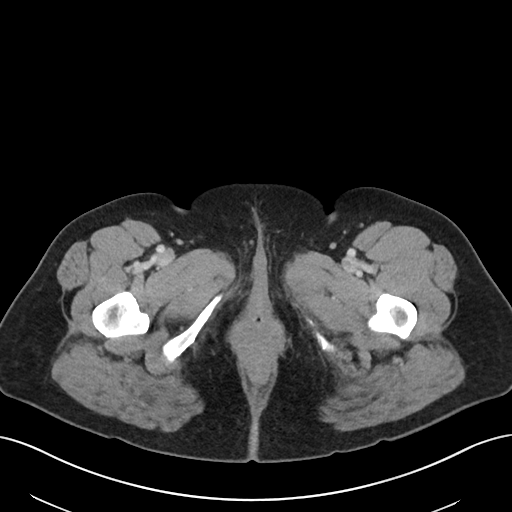
[im 5/89  bone]
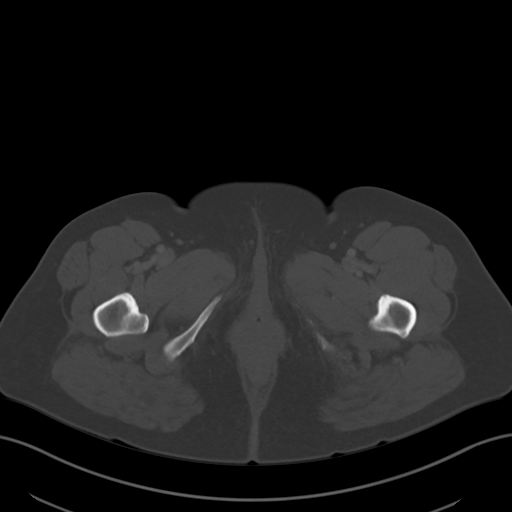
[im 10/89  soft-tissue]
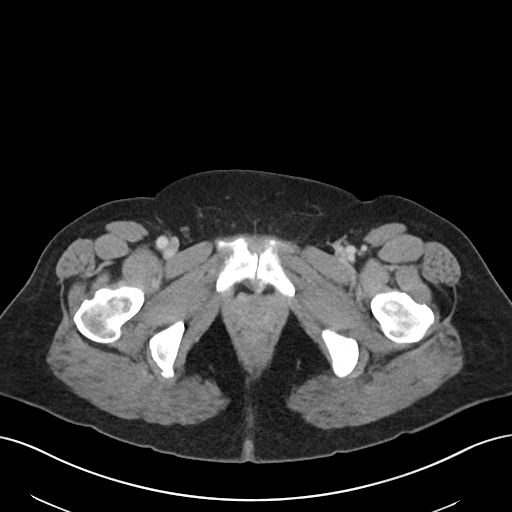
[im 20/89  soft-tissue]
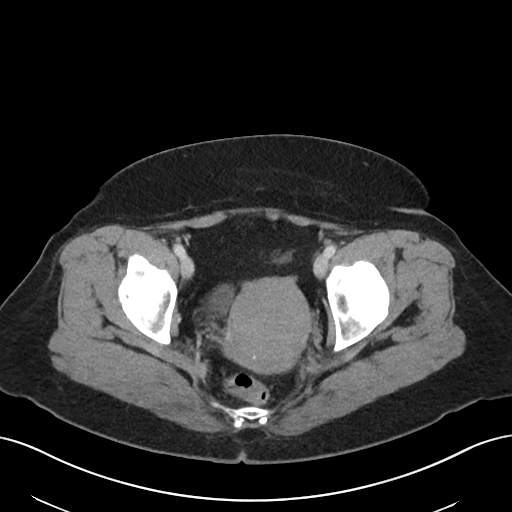
[im 25/89  soft-tissue]
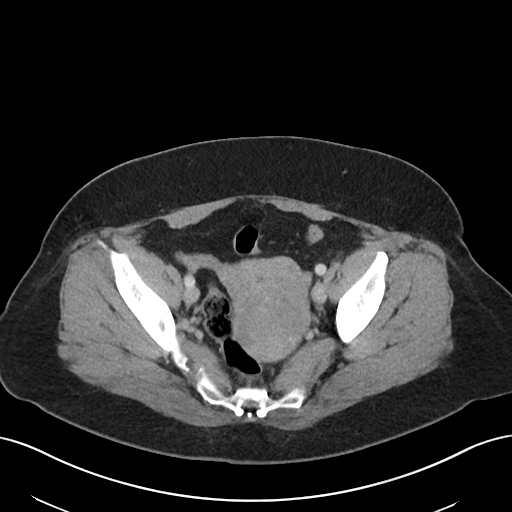
[im 30/89  soft-tissue]
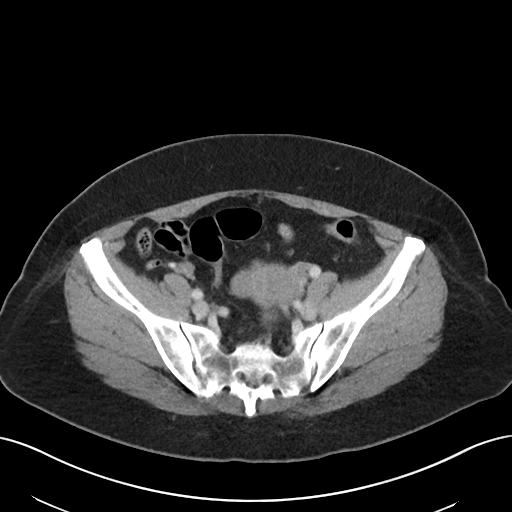
[im 35/89  soft-tissue]
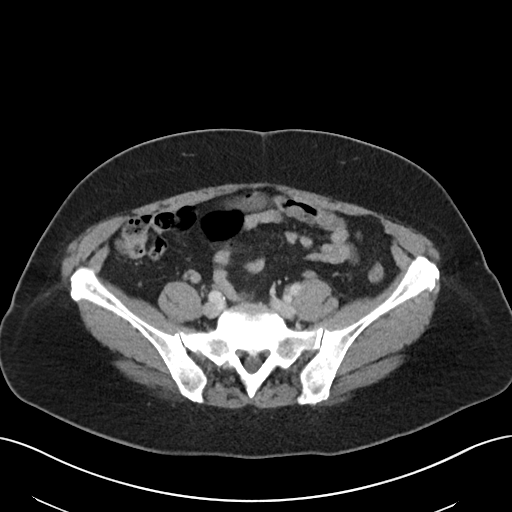
[im 40/89  soft-tissue]
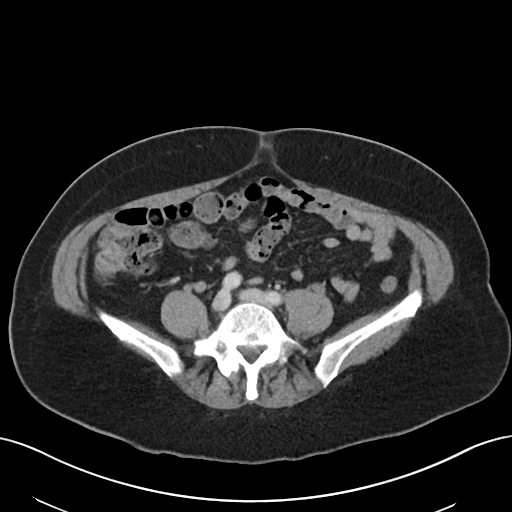
[im 49/89  soft-tissue]
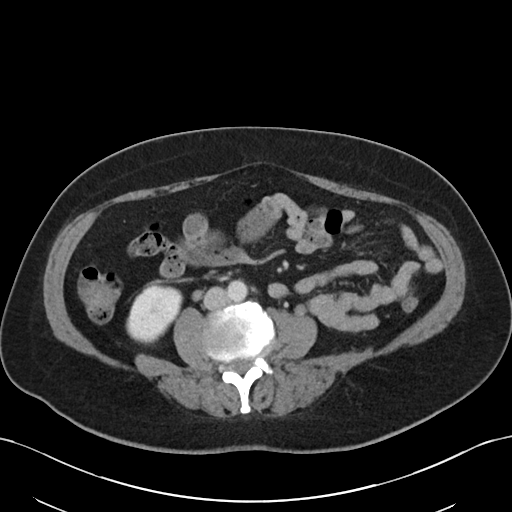
[im 54/89  soft-tissue]
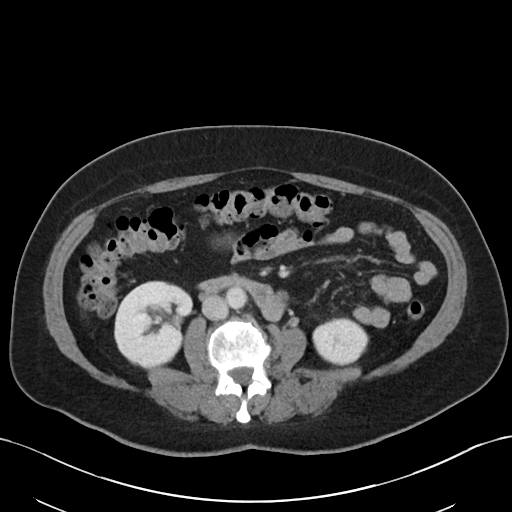
[im 54/89  bone]
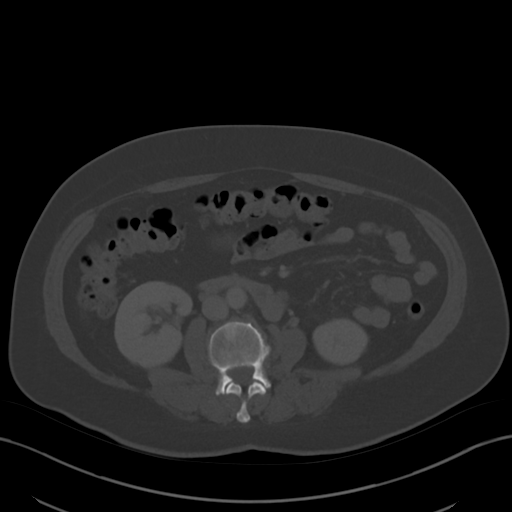
[im 59/89  soft-tissue]
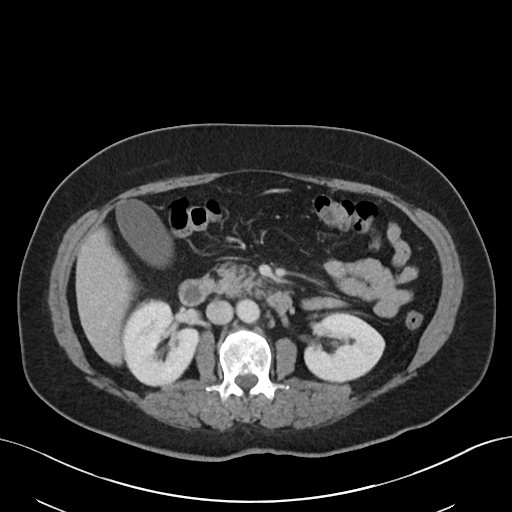
[im 64/89  soft-tissue]
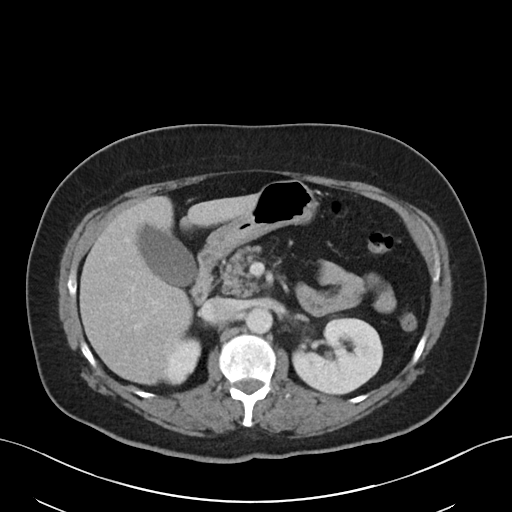
[im 69/89  soft-tissue]
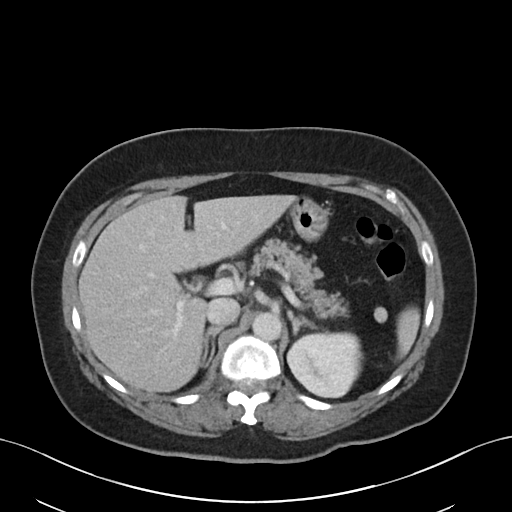
[im 79/89  soft-tissue]
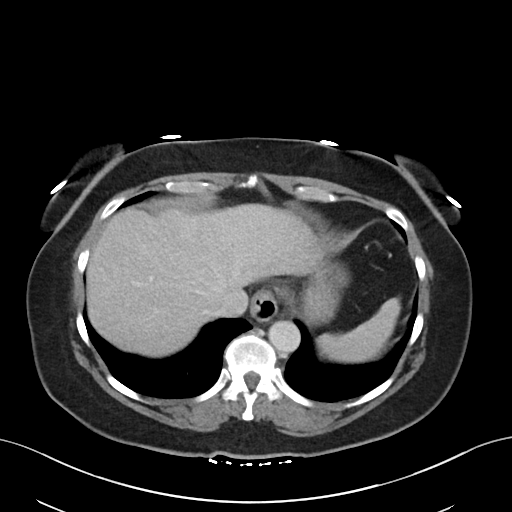
[im 84/89  soft-tissue]
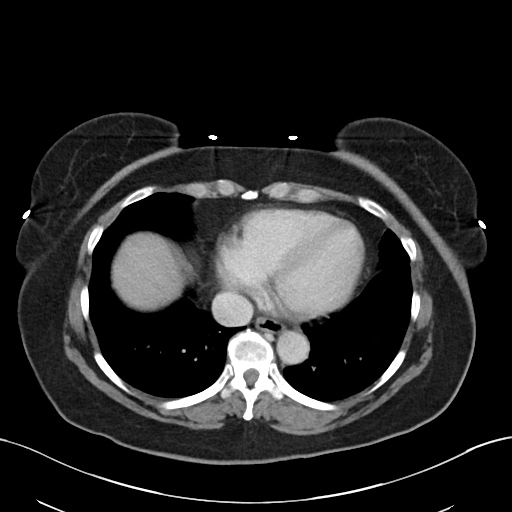

[Series 5: coronal · coronal · 0.84mm/px · 3 of 95 slices shown]
[im 32/95  soft-tissue]
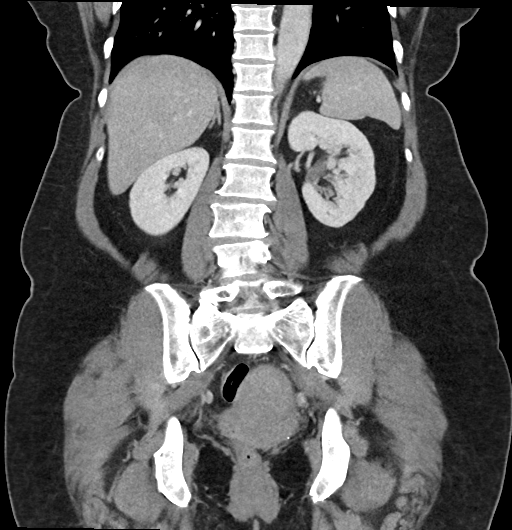
[im 42/95  soft-tissue]
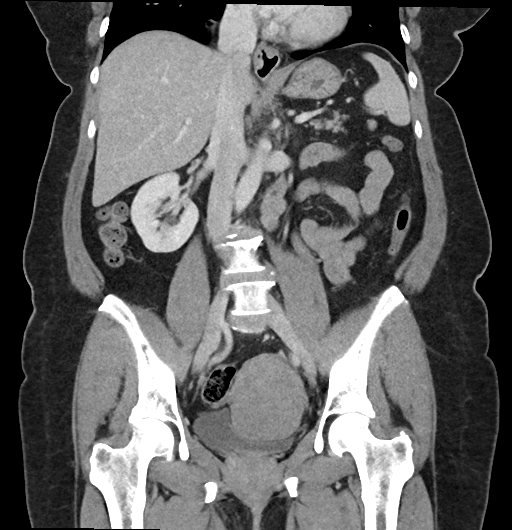
[im 53/95  soft-tissue]
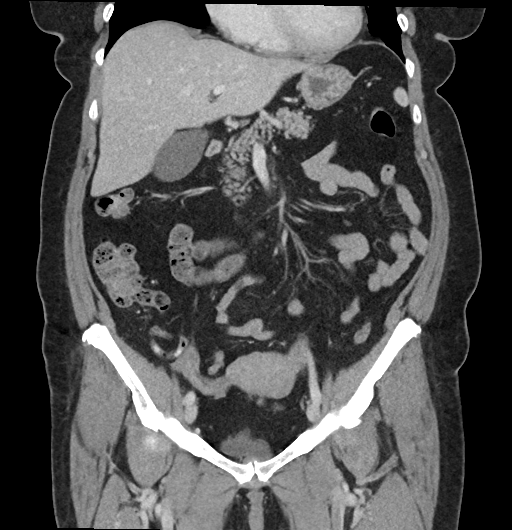

[17 of 46 positions shown; findings below may reference images not displayed]

RADIATION DOSE REDUCTION: This exam was performed according to the
departmental dose-optimization program which includes automated
exposure control, adjustment of the mA and/or kV according to
patient size and/or use of iterative reconstruction technique.

CONTRAST:  85mL OMNIPAQUE IOHEXOL 300 MG/ML  SOLN
FINDINGS: Lower chest: No acute abnormality.

Hepatobiliary: Small hepatic cyst. No gallstones, gallbladder wall
thickening, or biliary dilatation.

Pancreas: Unremarkable. No pancreatic ductal dilatation or
surrounding inflammatory changes.

Spleen: Normal in size without focal abnormality.

Adrenals/Urinary Tract: Adrenal glands are unremarkable. Kidneys are
normal, without renal calculi, focal lesion, or hydronephrosis.
Bladder is unremarkable.

Stomach/Bowel: Stomach is within normal limits. Appendix appears
normal. No evidence of bowel wall thickening, distention, or
inflammatory changes.

Vascular/Lymphatic: No significant vascular findings are present. No
enlarged abdominal or pelvic lymph nodes.

Reproductive: Multiple uterine masses. Dominant 6.6 x 7.1 by 6.1 cm
lower uterine segment mass

Other: Negative for pelvic effusion or free air

Musculoskeletal: No acute or significant osseous findings.
IMPRESSION: 1. No CT evidence for acute intra-abdominal or pelvic abnormality.
2. Lobulated uterus with multiple masses likely fibroids, including
dominant 7.1 cm lower uterine segment mass.

## 2023-08-20 LAB — CYTOLOGY - PAP
Comment: NEGATIVE
Diagnosis: NEGATIVE
High risk HPV: NEGATIVE

## 2023-08-24 LAB — HM PAP SMEAR: HM Pap smear: NEGATIVE

## 2023-12-31 ENCOUNTER — Telehealth: Payer: Self-pay | Admitting: Pharmacy Technician

## 2023-12-31 ENCOUNTER — Ambulatory Visit (HOSPITAL_BASED_OUTPATIENT_CLINIC_OR_DEPARTMENT_OTHER): Admitting: Internal Medicine

## 2023-12-31 ENCOUNTER — Encounter (HOSPITAL_BASED_OUTPATIENT_CLINIC_OR_DEPARTMENT_OTHER): Payer: Self-pay | Admitting: Internal Medicine

## 2023-12-31 ENCOUNTER — Other Ambulatory Visit (HOSPITAL_COMMUNITY): Payer: Self-pay

## 2023-12-31 ENCOUNTER — Other Ambulatory Visit (HOSPITAL_BASED_OUTPATIENT_CLINIC_OR_DEPARTMENT_OTHER): Payer: Self-pay

## 2023-12-31 ENCOUNTER — Telehealth (HOSPITAL_BASED_OUTPATIENT_CLINIC_OR_DEPARTMENT_OTHER): Payer: Self-pay | Admitting: *Deleted

## 2023-12-31 VITALS — BP 128/74 | HR 78 | Ht 61.0 in | Wt 178.0 lb

## 2023-12-31 DIAGNOSIS — Z79899 Other long term (current) drug therapy: Secondary | ICD-10-CM

## 2023-12-31 DIAGNOSIS — E7800 Pure hypercholesterolemia, unspecified: Secondary | ICD-10-CM

## 2023-12-31 DIAGNOSIS — T466X5D Adverse effect of antihyperlipidemic and antiarteriosclerotic drugs, subsequent encounter: Secondary | ICD-10-CM | POA: Diagnosis not present

## 2023-12-31 DIAGNOSIS — M791 Myalgia, unspecified site: Secondary | ICD-10-CM | POA: Diagnosis not present

## 2023-12-31 MED ORDER — REPATHA SURECLICK 140 MG/ML ~~LOC~~ SOAJ
140.0000 mg | SUBCUTANEOUS | 6 refills | Status: DC
Start: 2023-12-31 — End: 2024-01-07
  Filled 2023-12-31: qty 2, 28d supply, fill #0

## 2023-12-31 NOTE — Progress Notes (Signed)
 LIPID CLINIC CONSULT NOTE  Chief Complaint:  Familial hyperlipidemia  Primary Care Physician: Alyssa Mems, MD  Primary Cardiologist:  None  HPI:  Alyssa Howe is a 63 y.o. female who is being seen today for the evaluation of familial hyperlipidemia at the request of Alyssa Mems, MD. this is a pleasant 63 year old female currently referred for evaluation management of familial hyperlipidemia.  She has a recent history of high cholesterol but despite weight loss and change in diet has not noted any improvement in her cholesterol.  She has been on statin therapy for this over the years including rosuvastatin and atorvastatin, both of which caused myalgias.  More recently she was placed on pravastatin which also caused myalgias.  She was previously cycling but had to stop doing that because of leg pain.  When she stopped the statins those symptoms have gone away.  She tries to reduce saturated fats in her diet.  Recent lipid showed total cholesterol 285, triglycerides 171, HDL 53 and LDL 200, concerning for possible familial hyperlipidemia.  PMHx:  Past Medical History:  Diagnosis Date   High cholesterol    Hypertension     Past Surgical History:  Procedure Laterality Date   CESAREAN SECTION      FAMHx:  History reviewed. No pertinent family history.  SOCHx:   reports that she has never smoked. She has never used smokeless tobacco. She reports current alcohol use. She reports that she does not use drugs.  ALLERGIES:  No Known Allergies  ROS: Pertinent items noted in HPI and remainder of comprehensive ROS otherwise negative.  HOME MEDS: Current Outpatient Medications on File Prior to Visit  Medication Sig Dispense Refill   acetaminophen (TYLENOL) 500 MG tablet Take 500 mg by mouth every 6 (six) hours as needed.     Ascorbic Acid (VITAMIN C PO) Take by mouth.     ibuprofen  (ADVIL ) 600 MG tablet Take 1 tablet (600 mg total) by mouth every 8 (eight) hours as needed for  up to 30 doses for mild pain or moderate pain. Take with food 30 tablet 0   olmesartan-hydrochlorothiazide (BENICAR HCT) 40-12.5 MG tablet Take 1 tablet by mouth daily.     OZEMPIC, 2 MG/DOSE, 8 MG/3ML SOPN      VITAMIN D, CHOLECALCIFEROL, PO Take by mouth.     Omeprazole Magnesium (PRILOSEC OTC PO) Take by mouth. (Patient not taking: Reported on 12/31/2023)     pravastatin (PRAVACHOL) 20 MG tablet Take 20 mg by mouth daily. (Patient not taking: Reported on 12/31/2023)     No current facility-administered medications on file prior to visit.    LABS/IMAGING: No results found for this or any previous visit (from the past 48 hours). No results found.  LIPID PANEL: No results found for: CHOL, TRIG, HDL, CHOLHDL, VLDL, LDLCALC, LDLDIRECT  No results found for: LIPOA   WEIGHTS: Wt Readings from Last 3 Encounters:  12/31/23 178 lb (80.7 kg)    VITALS: BP 128/74 (BP Location: Left Arm, Patient Position: Sitting, Cuff Size: Normal)   Pulse 78   Ht 5' 1 (1.549 m)   Wt 178 lb (80.7 kg)   SpO2 96%   BMI 33.63 kg/m   EXAM: Deferred  EKG: Deferred  ASSESSMENT: LDL greater than 190, possible familial hyperlipidemia Statin intolerant-myalgias Hypertension Obesity  PLAN: 1.   [name] has an LDL greater than 190 which is suggestive of possible familial hyperlipidemia.  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  Unfortunately she cannot tolerate  statins due to myalgias and has been on 3 different statins with marked improvement in her symptoms after stopping them.  At this point I think she would benefit most from a PCSK9 inhibitor.  Will reach out for prior authorization for Repatha .  Plan to repeat lipid NMR and LP(a) in about 3 to 4 months.  I had also like to get a coronary calcium score.  This may help better assess whether she has developed any early onset heart disease.  Thanks again for the kind referral.  Vinie KYM Maxcy, MD, [facility], FNLA, FACP  [place]  [facility]  [facility]  Medical Director of the Advanced Lipid Disorders &  Cardiovascular Risk Reduction Clinic Diplomate of the American Board of Clinical Lipidology Attending Cardiologist  Direct Dial: [phone]  Fax: [phone]  Website:  [email or web address]  Vinie BROCKS [name] 12/31/2023, 10:18 AM

## 2023-12-31 NOTE — Telephone Encounter (Signed)
 Patient returned call, she is in agreement for the Repatha .

## 2023-12-31 NOTE — Telephone Encounter (Signed)
 Pharmacy Patient Advocate Encounter   Received notification from Physician's Office that prior authorization for Repatha  is required/requested.   Insurance verification completed.   The patient is insured through [facility] .   Per test claim: The current 12/31/23 day co-pay is, $45.00- one month.  No PA needed at this time. This test claim was processed through [facility]- copay amounts may vary at other pharmacies due to pharmacy/plan contracts, or as the patient moves through the different stages of their insurance plan.

## 2023-12-31 NOTE — Telephone Encounter (Signed)
 Left the pt a message to call the office back to endorse Repatha  cost of $45 and no PA needed at this time per PA Team.

## 2023-12-31 NOTE — Patient Instructions (Addendum)
 Medication Instructions:   STOP TAKING PRAVASTATIN    Dr. Mona recommends REPATHA  (PCSK9). This is an injectable cholesterol medication self-administered once every 14 days. This medication will likely need prior approval with your insurance company, which we will work on. If the medication is not approved initially, we may need to do an appeal with your insurance. If approved, we will provide you with copay and cost information. We'll then send the prescription to your pharmacy. We would have you complete another set of fasting labs between 3-4 months to reassess cholesterol.   Repatha  is self-injected once every 14 days in subcutaneous or fatty tissue - such as belly or side/outer/upper thigh. It is best stored in the refrigerator but is stable at room temp up to 28 days. Please take the pen-injector out of fridge about 30 minutes - 1 hour prior to injection, to allow it to warm closer to room temperature.   This medication is very effective in lowering LDL and can lower LPa, as Dr. Mona mentioned. It is also generally well tolerated -- most common reaction may be cold-like symptoms such as runny nose, scratchy throat, as this is an antibody therapy. It is generally self-limiting and after a few doses, your body should have normalized to the medication.   Here is a demo video: https://www.repatha .com/how-to-start-repatha -injection   If you need a co-pay card for Repatha : https://www.repatha .com/repatha -cost If you need a co-pay card for Praluent: https://[email or web address].https://sullivan-[email or web address]/  Patient Assistance:    These foundations have funds at various times.   The [employer] Foundation: https://www.panfoundation.org/disease-funds/hypercholesterolemia/ -- can sign up for wait list  The [facility] offers assistance to help pay for medication copays.  They will cover copays for all cholesterol lowering meds, including statins, fibrates, omega-3 fish oils like Vascepa, ezetimibe,  Repatha , Praluent, Nexletol, Nexlizet.  The cards are usually good for $2,500 or 12 months, whichever comes first. Our fax # is [phone] (you will need this to apply) Go to healthwellfoundation.org Click on "Apply Now" Answer questions as to whom is applying (patient or representative) Your disease fund will be "hypercholesterolemia - Medicare access" They will ask questions about finances and which medications you are taking for cholesterol When you submit, the approval is usually within minutes.  You will need to print the card information from the site You will need to show this information to your pharmacy, they will bill your Medicare Part D plan first -then bill [employer] Well --for the copay.   You can also call them at [phone], although the hold times can be quite long.     *If you need a refill on your cardiac medications before your next appointment, please call your pharmacy*   Lab Work:  IN 3-4 MONTHS HERE AT OUR [facility] ON THE 3RD FLOOR [address] AT PRIMARY [facility]ARE--NMR LIPOPROFILE AND LIPOPROTEIN A--PLEASE COME FASTING TO THIS LAB APPOINTMENT  If you have labs (blood work) drawn today and your tests are completely normal, you will receive your results only by: MyChart Message (if you have MyChart) OR A paper copy in the mail If you have any lab test that is abnormal or we need to change your treatment, we will call you to review the results.    Testing/Procedures:  CARDIAC CALCIUM SCORE (SELF PAY)    Follow-Up:  AS NEEDED WITH DR. HILTY IN LIPID CLINIC

## 2023-12-31 NOTE — Addendum Note (Signed)
 Addended by: Arvella Massingale W on: 12/31/2023 03:55 PM   Modules accepted: Orders

## 2023-12-31 NOTE — Telephone Encounter (Addendum)
-----   Message from Camelia JONETTA Slade sent at 12/31/2023 12:11 PM EDT ----- Regarding: RE: NEW START REPATHA  PER DR HILTY Repatha  is $45.00 and no PA is needed at this time.    ----- Message ----- From: Gladis Porter HERO, LPN Sent: 1/71/7974  10:50 AM EDT To: Andriette HERO Eke, RN; Rx Prior Auth Team; Rx M# Subject: NEW START REPATHA  PER DR HILTY                 Dr. Mona had a consultation on this pt today in lipid clinic and wants to pursue starting her on Repatha  pending PA approval  Can you please assist with this and let us  know the determination and what pharmacy to send this to?  Please notify pt as well.    Thanks for all you do, Porter

## 2023-12-31 NOTE — Telephone Encounter (Signed)
 Spoke with pt, repatha  script sent to [facility].

## 2024-01-07 ENCOUNTER — Other Ambulatory Visit (HOSPITAL_BASED_OUTPATIENT_CLINIC_OR_DEPARTMENT_OTHER): Payer: Self-pay

## 2024-01-07 MED ORDER — REPATHA SURECLICK 140 MG/ML ~~LOC~~ SOAJ: 140.0000 mg | SUBCUTANEOUS | 6 refills | Status: AC

## 2024-01-07 NOTE — Addendum Note (Signed)
 Addended by: FREDIRICK BEAU B on: 01/07/2024 11:23 AM   Modules accepted: Orders

## 2024-01-07 NOTE — Telephone Encounter (Signed)
 Spoke with patient and advised sent to [facility] as requested

## 2024-01-07 NOTE — Telephone Encounter (Signed)
 Pt called in asking for this med to be sent to [facility], it was sent to [facility].    Evolocumab  (REPATHA  SURECLICK) 140 MG/ML [facility] [address], [place] - [address] Phone: [phone]  Fax: [phone]

## 2024-01-21 ENCOUNTER — Ambulatory Visit (HOSPITAL_BASED_OUTPATIENT_CLINIC_OR_DEPARTMENT_OTHER)
Admission: RE | Admit: 2024-01-21 | Discharge: 2024-01-21 | Disposition: A | Discharge status: Home / Self Care | Payer: Self-pay | Source: Ambulatory Visit | Attending: Internal Medicine | Admitting: Internal Medicine

## 2024-01-21 DIAGNOSIS — E7800 Pure hypercholesterolemia, unspecified: Secondary | ICD-10-CM | POA: Insufficient documentation

## 2024-01-21 DIAGNOSIS — M791 Myalgia, unspecified site: Secondary | ICD-10-CM | POA: Insufficient documentation

## 2024-01-21 DIAGNOSIS — T466X5A Adverse effect of antihyperlipidemic and antiarteriosclerotic drugs, initial encounter: Secondary | ICD-10-CM | POA: Insufficient documentation

## 2024-01-21 DIAGNOSIS — Z79899 Other long term (current) drug therapy: Secondary | ICD-10-CM | POA: Insufficient documentation

## 2024-01-22 ENCOUNTER — Ambulatory Visit: Payer: Self-pay | Admitting: Internal Medicine

## 2024-03-02 ENCOUNTER — Other Ambulatory Visit: Payer: Self-pay | Admitting: Family Medicine

## 2024-03-02 DIAGNOSIS — Z1231 Encounter for screening mammogram for malignant neoplasm of breast: Secondary | ICD-10-CM

## 2024-03-15 ENCOUNTER — Ambulatory Visit
Admission: RE | Admit: 2024-03-15 | Discharge: 2024-03-15 | Disposition: A | Discharge status: Home / Self Care | Source: Ambulatory Visit | Attending: Family Medicine | Admitting: Family Medicine

## 2024-03-15 DIAGNOSIS — Z1231 Encounter for screening mammogram for malignant neoplasm of breast: Secondary | ICD-10-CM

## 2024-03-17 ENCOUNTER — Ambulatory Visit (HOSPITAL_BASED_OUTPATIENT_CLINIC_OR_DEPARTMENT_OTHER): Payer: Self-pay | Admitting: Family Medicine

## 2024-03-17 ENCOUNTER — Other Ambulatory Visit (HOSPITAL_BASED_OUTPATIENT_CLINIC_OR_DEPARTMENT_OTHER): Payer: Self-pay | Admitting: Family Medicine

## 2024-03-17 DIAGNOSIS — R928 Other abnormal and inconclusive findings on diagnostic imaging of breast: Secondary | ICD-10-CM

## 2024-03-17 NOTE — Progress Notes (Signed)
 Discussed result of mammogram with patient over the phone. Order placed for R Breast US  and patient agreeable.

## 2024-03-18 ENCOUNTER — Other Ambulatory Visit: Payer: Self-pay | Admitting: Family Medicine

## 2024-03-18 DIAGNOSIS — R928 Other abnormal and inconclusive findings on diagnostic imaging of breast: Secondary | ICD-10-CM

## 2024-04-05 ENCOUNTER — Ambulatory Visit
Admission: RE | Admit: 2024-04-05 | Discharge: 2024-04-05 | Disposition: A | Source: Ambulatory Visit | Attending: Family Medicine | Admitting: Family Medicine

## 2024-04-05 ENCOUNTER — Ambulatory Visit (HOSPITAL_BASED_OUTPATIENT_CLINIC_OR_DEPARTMENT_OTHER): Payer: Self-pay | Admitting: Family Medicine

## 2024-04-05 DIAGNOSIS — R928 Other abnormal and inconclusive findings on diagnostic imaging of breast: Secondary | ICD-10-CM

## 2024-04-05 NOTE — Progress Notes (Signed)
 Alyssa Howe,  Your breast ultrasound is negative for malignancy. There is a benign cyst present needing no further evaluation. We will resume yearly mammograms.

## 2024-04-07 ENCOUNTER — Ambulatory Visit (HOSPITAL_BASED_OUTPATIENT_CLINIC_OR_DEPARTMENT_OTHER): Admitting: Family Medicine

## 2024-04-07 ENCOUNTER — Encounter (HOSPITAL_BASED_OUTPATIENT_CLINIC_OR_DEPARTMENT_OTHER): Payer: Self-pay | Admitting: Family Medicine

## 2024-04-07 VITALS — BP 136/84 | HR 86 | Ht 61.0 in | Wt 175.0 lb

## 2024-04-07 DIAGNOSIS — N62 Hypertrophy of breast: Secondary | ICD-10-CM

## 2024-04-07 DIAGNOSIS — G8929 Other chronic pain: Secondary | ICD-10-CM

## 2024-04-07 DIAGNOSIS — E119 Type 2 diabetes mellitus without complications: Secondary | ICD-10-CM

## 2024-04-07 DIAGNOSIS — M1711 Unilateral primary osteoarthritis, right knee: Secondary | ICD-10-CM

## 2024-04-07 DIAGNOSIS — M546 Pain in thoracic spine: Secondary | ICD-10-CM

## 2024-04-07 DIAGNOSIS — E782 Mixed hyperlipidemia: Secondary | ICD-10-CM

## 2024-04-07 DIAGNOSIS — Z7689 Persons encountering health services in other specified circumstances: Secondary | ICD-10-CM

## 2024-04-07 NOTE — Addendum Note (Signed)
 Addended by: Oleda Borski on: 04/07/2024 03:32 PM   Modules accepted: Orders

## 2024-04-07 NOTE — Patient Instructions (Addendum)
 [facility]astic Surgery  Plastic surgeon [address]  [phone] Open  Closes 5?PM  Please return for fasting blood work.  For fasting, if your blood work is in the morning please do not eat any food after midnight.  You may have water or black coffee prior to your lab work.  Please take all regularly prescribed medications even if you are fasting.  If your blood work is in the afternoon, please fast for at least 5 to 6 hours.  You may continue to drink water and/or black coffee prior to your lab work.  Please take all scheduled medications even if you are fasting.

## 2024-04-07 NOTE — Progress Notes (Signed)
 New Patient Office Visit  Subjective:   Alyssa Howe Dec 07, 1960 04/07/2024  Chief Complaint  Patient presents with   New Patient (Initial Visit)    Patient is here today to get established with the practice. Needs referral to orthopedics due to knee pain and also has pain in upper back and in shoulders. Also has some burning in feet at night.    Discussed the use of AI scribe software for clinical note transcription with the patient, who gave verbal consent to proceed.  History of Present Illness Alyssa Howe is a 63 year old female with knee pain and diabetes who presents to establish care for a referral to orthopedics and evaluation of nighttime pain and numbness. Prior PCP is [facility].   RIGHT KNEE PAIN:  She experiences chronic osteoarthritic right knee pain primarily on the medial aspect of her right knee, which initially improved with physiotherapy and biking but has since recurred. The pain is particularly bothersome at night, necessitating the use of a knee pillow for comfort. She reports having had  x-rays of her knee in 2019 with [facility] which showed degeneration.  NUMBNESS:  She also experiences upper back and shoulder pain that sometimes radiates to her arms, along with a burning sensation in her feet at night. She occasionally takes Advil  at night to manage the pain. No recent falls or injuries to her back.  She has a history of diabetes and is currently taking Ozempic. Her last A1c in April was 6.1 per chart review in care everywhere. She reports numbness and tingling in her feet at night and has experienced nausea without significant weight loss. Her physical activity has decreased, although she remains active around her house and occasionally walks in her neighborhood.  Macromastia:  She has concerns about her breast size contributing to back pain and mentions a history of being denied insurance coverage for breast reduction surgery. She describes asymmetry and  a mass in her breast, which she feels may be contributing to her discomfort. She also notes skin irritation under her breasts, for which she uses a cream.    The following portions of the patient's history were reviewed and updated as appropriate: past medical history, past surgical history, family history, social history, allergies, medications, and problem list.   Patient Active Problem List   Diagnosis Date Noted   Mixed hyperlipidemia 04/07/2024   Type 2 diabetes mellitus without complication, without long-term current use of insulin (HCC) 04/07/2024   Chronic bilateral thoracic back pain 04/07/2024   Primary osteoarthritis of right knee 08/28/2017   Class 2 obesity due to excess calories without serious comorbidity with body mass index (BMI) of 35.0 to 35.9 in adult 08/13/2016   Macromastia 08/13/2016   Past Medical History:  Diagnosis Date   High cholesterol    Hypertension    Past Surgical History:  Procedure Laterality Date   CESAREAN SECTION     Family History  Problem Relation Age of Onset   Breast cancer Neg Hx    Social History   Socioeconomic History   Marital status: Married    Spouse name: Not on file   Number of children: Not on file   Years of education: Not on file   Highest education level: Not on file  Occupational History   Not on file  Tobacco Use   Smoking status: Never   Smokeless tobacco: Never  Substance and Sexual Activity   Alcohol use: Yes    Comment: social  Drug use: Never   Sexual activity: Not on file  Other Topics Concern   Not on file  Social History Narrative   Not on file   Social Drivers of Health   Financial Resource Strain: Low Risk  (04/07/2024)   Overall Financial Resource Strain (CARDIA)    Difficulty of Paying Living Expenses: Not very hard  Food Insecurity: No Food Insecurity (04/07/2024)   Hunger Vital Sign    Worried About Running Out of Food in the Last Year: Never true    Ran Out of Food in the Last Year: Never  true  Transportation Needs: No Transportation Needs (04/07/2024)   PRAPARE - Administrator, Civil Service (Medical): No    Lack of Transportation (Non-Medical): No  Physical Activity: Insufficiently Active (04/07/2024)   Exercise Vital Sign    Days of Exercise per Week: 2 days    Minutes of Exercise per Session: 60 min  Stress: No Stress Concern Present (04/07/2024)   [employer] of Occupational Health - Occupational Stress Questionnaire    Feeling of Stress: Only a little  Social Connections: Moderately Integrated (04/07/2024)   Social Connection and Isolation Panel    Frequency of Communication with Friends and Family: More than three times a week    Frequency of Social Gatherings with Friends and Family: More than three times a week    Attends Religious Services: Patient declined    Database Administrator or Organizations: Patient declined    Attends Engineer, Structural: More than 4 times per year    Marital Status: Married  Catering Manager Violence: Not At Risk (04/07/2024)   Humiliation, Afraid, Rape, and Kick questionnaire    Fear of Current or Ex-Partner: No    Emotionally Abused: No    Physically Abused: No    Sexually Abused: No   Outpatient Medications Prior to Visit  Medication Sig Dispense Refill   acetaminophen (TYLENOL) 500 MG tablet Take 500 mg by mouth every 6 (six) hours as needed.     Ascorbic Acid (VITAMIN C PO) Take by mouth.     Evolocumab  (REPATHA  SURECLICK) 140 MG/ML SOAJ Inject 140 mg into the skin every 14 (fourteen) days. 2 mL 6   ibuprofen  (ADVIL ) 600 MG tablet Take 1 tablet (600 mg total) by mouth every 8 (eight) hours as needed for up to 30 doses for mild pain or moderate pain. Take with food 30 tablet 0   olmesartan-hydrochlorothiazide (BENICAR HCT) 40-12.5 MG tablet Take 1 tablet by mouth daily.     OZEMPIC, 2 MG/DOSE, 8 MG/3ML SOPN      VITAMIN D, CHOLECALCIFEROL, PO Take by mouth.     Omeprazole Magnesium (PRILOSEC OTC PO)  Take by mouth. (Patient not taking: Reported on 12/31/2023)     No facility-administered medications prior to visit.   Allergies  Allergen Reactions   Atorvastatin Other (See Comments)    Myalgias    Pravastatin Other (See Comments)    Myalgias    Rosuvastatin Other (See Comments)    Myalgias     ROS: A complete ROS was performed with pertinent positives/negatives noted in the HPI. The remainder of the ROS are negative.   Objective:   Today's Vitals   04/07/24 1354  BP: 136/84  Pulse: 86  SpO2: 97%  Weight: 175 lb (79.4 kg)  Height: 5' 1 (1.549 m)    GENERAL: Well-appearing, in NAD. Well nourished.  SKIN: Pink, warm and dry.  Head: Normocephalic. NECK: Trachea midline. Full  ROM w/o pain or tenderness.  RESPIRATORY: Chest wall symmetrical. Respirations even and non-labored. Breath sounds clear to auscultation bilaterally. Macromastia present.  CARDIAC: S1, S2 present, regular rate and rhythm without murmur or gallops. Peripheral pulses 2+ bilaterally.  MSK: Muscle tone and strength appropriate for age.  EXTREMITIES: Without clubbing, cyanosis, or edema.  NEUROLOGIC: No motor or sensory deficits. Steady, even gait. C2-C12 intact.  PSYCH/MENTAL STATUS: Alert, oriented x 3. Cooperative, appropriate mood and affect.    No results found for any visits on 04/07/24.     Assessment & Plan:  1. Encounter to establish care with new doctor (Primary) Discussed medical, surgical and family history. Discussed role of PCP.   2. Primary osteoarthritis of right knee Referral placed per patient request to Ortho for further evaluation. Discussed conservative management.  - Ambulatory referral to Orthopedic Surgery  3. Chronic bilateral thoracic back pain 4. Macromastia Macromastia contributing to back and shoulder pain for several years. Patient is desiring reduction surgery after failed conservative treatment. Recommend evaluation by Plastic Surgery with contact info provided.   5.  Mixed hyperlipidemia Currently managed by Dr. Mona with cardiology. Tolerating repatha  well.   6. Type 2 diabetes mellitus without complication, without long-term current use of insulin (HCC) Last A1c 6.1 in April 2025. Will check with fasting labs and determine if contributing to numbness and tingling in feet at night. Discussed good dietary changes, exercise with patient.   Patient to reach out to office if new, worrisome, or unresolved symptoms arise or if no improvement in patient's condition. Patient verbalized understanding and is agreeable to treatment plan. All questions answered to patient's satisfaction.    Return in about 4 months (around 08/06/2024) for ANNUAL PHYSICAL, DIABETES CHECK UP.    Thersia Schuyler Stark, [place]

## 2024-04-15 LAB — NMR, LIPOPROFILE
Cholesterol, Total: 169 mg/dL (ref 100–199)
HDL Particle Number: 39.5 umol/L (ref >=30.5)
HDL-C: 45 mg/dL (ref >39)
LDL Particle Number: 1185 nmol/L — ABNORMAL HIGH (ref <1000)
LDL Size: 21.1 nm (ref >20.5)
LDL-C (NIH Calc): 98 mg/dL (ref 0–99)
LP-IR Score: 59 — ABNORMAL HIGH (ref <=45)
Small LDL Particle Number: 618 nmol/L — ABNORMAL HIGH (ref <=527)
Triglycerides: 150 mg/dL — ABNORMAL HIGH (ref 0–149)

## 2024-04-15 LAB — CBC WITH DIFFERENTIAL/PLATELET
Basophils Absolute: 0.1 x10E3/uL (ref 0.0–0.2)
Basos: 1 %
EOS (ABSOLUTE): 0.2 x10E3/uL (ref 0.0–0.4)
Eos: 3 %
Hematocrit: 51.5 % — ABNORMAL HIGH (ref 34.0–46.6)
Hemoglobin: 16.5 g/dL — ABNORMAL HIGH (ref 11.1–15.9)
Immature Grans (Abs): 0 x10E3/uL (ref 0.0–0.1)
Immature Granulocytes: 0 %
Lymphocytes Absolute: 1.9 x10E3/uL (ref 0.7–3.1)
Lymphs: 33 %
MCH: 29.8 pg (ref 26.6–33.0)
MCHC: 32 g/dL (ref 31.5–35.7)
MCV: 93 fL (ref 79–97)
Monocytes Absolute: 0.5 x10E3/uL (ref 0.1–0.9)
Monocytes: 9 %
Neutrophils Absolute: 3.1 x10E3/uL (ref 1.4–7.0)
Neutrophils: 54 %
Platelets: 284 x10E3/uL (ref 150–450)
RBC: 5.53 x10E6/uL — ABNORMAL HIGH (ref 3.77–5.28)
RDW: 13.8 % (ref 11.7–15.4)
WBC: 5.7 x10E3/uL (ref 3.4–10.8)

## 2024-04-15 LAB — BASIC METABOLIC PANEL WITH GFR
BUN/Creatinine Ratio: 32 — ABNORMAL HIGH (ref 12–28)
BUN: 19 mg/dL (ref 8–27)
CO2: 25 mmol/L (ref 20–29)
Calcium: 9.5 mg/dL (ref 8.7–10.3)
Chloride: 102 mmol/L (ref 96–106)
Creatinine, Ser: 0.59 mg/dL (ref 0.57–1.00)
Glucose: 105 mg/dL — ABNORMAL HIGH (ref 70–99)
Potassium: 4.5 mmol/L (ref 3.5–5.2)
Sodium: 142 mmol/L (ref 134–144)
eGFR: 101 mL/min/1.73 (ref >59)

## 2024-04-15 LAB — HEMOGLOBIN A1C
Est. average glucose Bld gHb Est-mCnc: 128 mg/dL
Hgb A1c MFr Bld: 6.1 % — ABNORMAL HIGH (ref 4.8–5.6)

## 2024-04-15 LAB — VITAMIN B12: Vitamin B-12: 1191 pg/mL (ref 232–1245)

## 2024-04-15 LAB — LIPOPROTEIN A (LPA): Lipoprotein (a): 111.5 nmol/L — ABNORMAL HIGH (ref <75.0)

## 2024-04-18 ENCOUNTER — Ambulatory Visit (HOSPITAL_BASED_OUTPATIENT_CLINIC_OR_DEPARTMENT_OTHER): Payer: Self-pay | Admitting: Family Medicine

## 2024-04-18 NOTE — Progress Notes (Signed)
 Hi Iliani,  Your A1C is stable. Your hemoglobin and hematocrit, red blood cell counts are elevated. This can be caused by excess iron in the diet or supplement. Do you currently take any iron supplements?

## 2024-04-19 MED ORDER — EZETIMIBE 10 MG PO TABS: 10.0000 mg | ORAL_TABLET | Freq: Every day | ORAL | 3 refills | Status: AC

## 2024-04-19 NOTE — Addendum Note (Signed)
 Addended by: LORING ANDRIETTE HERO on: 04/19/2024 11:32 AM   Modules accepted: Orders

## 2024-05-18 ENCOUNTER — Ambulatory Visit (INDEPENDENT_AMBULATORY_CARE_PROVIDER_SITE_OTHER): Admitting: Orthopaedic Surgery

## 2024-05-18 ENCOUNTER — Ambulatory Visit (HOSPITAL_BASED_OUTPATIENT_CLINIC_OR_DEPARTMENT_OTHER)

## 2024-05-18 ENCOUNTER — Other Ambulatory Visit (HOSPITAL_BASED_OUTPATIENT_CLINIC_OR_DEPARTMENT_OTHER): Payer: Self-pay

## 2024-05-18 DIAGNOSIS — M25562 Pain in left knee: Secondary | ICD-10-CM

## 2024-05-18 DIAGNOSIS — G8929 Other chronic pain: Secondary | ICD-10-CM

## 2024-05-18 DIAGNOSIS — M1711 Unilateral primary osteoarthritis, right knee: Secondary | ICD-10-CM

## 2024-05-18 MED ORDER — TRIAMCINOLONE ACETONIDE 40 MG/ML IJ SUSP
80.0000 mg | INTRAMUSCULAR | Status: AC | PRN
  Administered 2024-05-18: 80 mg via INTRA_ARTICULAR

## 2024-05-18 MED ORDER — LIDOCAINE HCL 1 % IJ SOLN
4.0000 mL | INTRAMUSCULAR | Status: AC | PRN
  Administered 2024-05-18: 4 mL

## 2024-05-18 NOTE — Progress Notes (Signed)
 "   Chief Complaint: Right knee pain, left knee pain     History of Present Illness:    Alyssa Howe is a 64 y.o. female presents today with ongoing several months of bilateral knee pain.  She has been treated with physical therapy in the past which did give her relief.  She does enjoy biking which also help significantly although she has not been able to do this resulted in her knee pain.  She has also been watching her younger grandchildren recently.  She experiences pain predominantly medial aspect of the knee.  This is worse with laying directly on her side    PMH/PSH/Family History/Social History/Meds/Allergies:    Past Medical History:  Diagnosis Date   High cholesterol    Hypertension    Past Surgical History:  Procedure Laterality Date   BREAST BIOPSY  2002   Right Breast- [facility]   CESAREAN SECTION     COLONOSCOPY  09/2021   Social History   Socioeconomic History   Marital status: Married    Spouse name: Not on file   Number of children: Not on file   Years of education: Not on file   Highest education level: Not on file  Occupational History   Not on file  Tobacco Use   Smoking status: Never   Smokeless tobacco: Never  Substance and Sexual Activity   Alcohol use: Yes    Comment: social   Drug use: Never   Sexual activity: Not on file  Other Topics Concern   Not on file  Social History Narrative   Not on file   Social Drivers of Health   Tobacco Use: Low Risk (04/07/2024)   Patient History    Smoking Tobacco Use: Never    Smokeless Tobacco Use: Never    Passive Exposure: Not on file  Financial Resource Strain: Low Risk (04/07/2024)   Overall Financial Resource Strain (CARDIA)    Difficulty of Paying Living Expenses: Not very hard  Food Insecurity: No Food Insecurity (04/07/2024)   Epic    Worried About Radiation Protection Practitioner of Food in the Last Year: Never true    Ran Out of Food in the Last Year: Never true   Transportation Needs: No Transportation Needs (04/07/2024)   Epic    Lack of Transportation (Medical): No    Lack of Transportation (Non-Medical): No  Physical Activity: Insufficiently Active (04/07/2024)   Exercise Vital Sign    Days of Exercise per Week: 2 days    Minutes of Exercise per Session: 60 min  Stress: No Stress Concern Present (04/07/2024)   [employer] of Occupational Health - Occupational Stress Questionnaire    Feeling of Stress: Only a little  Social Connections: Moderately Integrated (04/07/2024)   Social Connection and Isolation Panel    Frequency of Communication with Friends and Family: More than three times a week    Frequency of Social Gatherings with Friends and Family: More than three times a week    Attends Religious Services: Patient declined    Active Member of Clubs or Organizations: Patient declined    Attends Banker Meetings: More than 4 times per year    Marital Status: Married  Depression (PHQ2-9): Low Risk (04/07/2024)   Depression (PHQ2-9)    PHQ-2 Score: 0  Alcohol Screen: Low Risk (04/07/2024)   Alcohol Screen    Last Alcohol Screening Score (AUDIT): 2  Housing: Low Risk (04/07/2024)   Epic    Unable to Pay for  Housing in the Last Year: No    Number of Times Moved in the Last Year: 0    Homeless in the Last Year: No  Utilities: Not At Risk (04/07/2024)   Epic    Threatened with loss of utilities: No  Health Literacy: Adequate Health Literacy (04/07/2024)   B1300 Health Literacy    Frequency of need for help with medical instructions: Rarely   Family History  Problem Relation Age of Onset   Breast cancer Neg Hx    Allergies[1] Current Outpatient Medications  Medication Sig Dispense Refill   acetaminophen (TYLENOL) 500 MG tablet Take 500 mg by mouth every 6 (six) hours as needed.     Ascorbic Acid (VITAMIN C PO) Take by mouth.     Evolocumab  (REPATHA  SURECLICK) 140 MG/ML SOAJ Inject 140 mg into the  skin every 14 (fourteen) days. 2 mL 6   ezetimibe  (ZETIA ) 10 MG tablet Take 1 tablet (10 mg total) by mouth daily. 90 tablet 3   ibuprofen  (ADVIL ) 600 MG tablet Take 1 tablet (600 mg total) by mouth every 8 (eight) hours as needed for up to 30 doses for mild pain or moderate pain. Take with food 30 tablet 0   olmesartan-hydrochlorothiazide (BENICAR HCT) 40-12.5 MG tablet Take 1 tablet by mouth daily.     OZEMPIC, 2 MG/DOSE, 8 MG/3ML SOPN      VITAMIN D, CHOLECALCIFEROL, PO Take by mouth.     No current facility-administered medications for this visit.   No results found.  Review of Systems:   A ROS was performed including pertinent positives and negatives as documented in the HPI.  Physical Exam :   Constitutional: NAD and appears stated age Neurological: Alert and oriented Psych: Appropriate affect and cooperative There were no vitals taken for this visit.   Comprehensive Musculoskeletal Exam:    Right knee with tenderness about the medial joint line with negative McMurray range of motion is from -3 to 135 degrees negative posterior drawer negative Lachman no varus valgus laxity   Imaging:   Xray (4 views right knee): Mild medial joint space narrowing    I personally reviewed and interpreted the radiographs.   Assessment and Plan:   64 y.o. female with mild medial joint space narrowing on the right knee consistent with either early or osteoarthritis or meniscal insufficiency.  That being said she would like to opt for a steroid injection today to get her some relief.  I will plan to see her back as needed  -Right knee ultrasound-guided injection provided after verbal consent obtained    Procedure Note  Patient: Alyssa Howe             Date of Birth: [birth year]           MRN: [id number]             Visit Date: 05/18/2024  Procedures: Visit Diagnoses:  1. Primary osteoarthritis of right knee   2. Chronic pain of left knee     Large Joint Inj: R knee on  05/18/2024 12:28 PM Indications: pain Details: 22 G 1.5 in needle, ultrasound-guided anterior approach  Arthrogram: No  Medications: 4 mL lidocaine  1 %; 80 mg triamcinolone  acetonide 40 MG/ML Outcome: tolerated well, no immediate complications Procedure, treatment alternatives, risks and benefits explained, specific risks discussed. Consent was given by the patient. Immediately prior to procedure a time out was called to verify the correct patient, procedure, equipment, support staff and site/side marked as required. Patient was prepped  and draped in the usual sterile fashion.         I personally saw and evaluated the patient, and participated in the management and treatment plan.  Elspeth Parker, MD Attending Physician, Orthopedic Surgery  This document was dictated using Dragon voice recognition software. A reasonable attempt at proof reading has been made to minimize errors.     [1] Allergies Allergen Reactions   Atorvastatin Other (See Comments)    Myalgias    Pravastatin Other (See Comments)    Myalgias    Rosuvastatin Other (See Comments)    Myalgias   "

## 2024-08-09 ENCOUNTER — Encounter (HOSPITAL_BASED_OUTPATIENT_CLINIC_OR_DEPARTMENT_OTHER): Admitting: Family Medicine
